# Patient Record
Sex: Male | Born: 1965 | Race: White | Hispanic: No | Marital: Single | Smoking: Current every day smoker
Health system: Southern US, Community
[De-identification: ages and names within clinical notes are randomized; demographics above are authoritative.]

## PROBLEM LIST (undated history)

## (undated) DIAGNOSIS — L309 Dermatitis, unspecified: Secondary | ICD-10-CM

## (undated) DIAGNOSIS — T7840XA Allergy, unspecified, initial encounter: Secondary | ICD-10-CM

## (undated) DIAGNOSIS — I10 Essential (primary) hypertension: Secondary | ICD-10-CM

## (undated) HISTORY — PX: KNEE SURGERY: SHX244

## (undated) HISTORY — DX: Allergy, unspecified, initial encounter: T78.40XA

## (undated) HISTORY — DX: Dermatitis, unspecified: L30.9

---

## 1998-06-19 ENCOUNTER — Emergency Department (HOSPITAL_COMMUNITY): Admission: EM | Admit: 1998-06-19 | Discharge: 1998-06-19 | Payer: Self-pay | Admitting: Emergency Medicine

## 2012-07-23 ENCOUNTER — Encounter (HOSPITAL_COMMUNITY): Payer: Self-pay

## 2012-07-23 ENCOUNTER — Emergency Department (HOSPITAL_COMMUNITY)
Admission: EM | Admit: 2012-07-23 | Discharge: 2012-07-23 | Disposition: A | Discharge status: Home / Self Care | Payer: Self-pay | Attending: Emergency Medicine | Admitting: Emergency Medicine

## 2012-07-23 DIAGNOSIS — M62830 Muscle spasm of back: Secondary | ICD-10-CM

## 2012-07-23 DIAGNOSIS — R209 Unspecified disturbances of skin sensation: Secondary | ICD-10-CM | POA: Insufficient documentation

## 2012-07-23 DIAGNOSIS — M25559 Pain in unspecified hip: Secondary | ICD-10-CM | POA: Insufficient documentation

## 2012-07-23 HISTORY — DX: Essential (primary) hypertension: I10

## 2012-07-23 LAB — URINALYSIS, ROUTINE W REFLEX MICROSCOPIC
Bilirubin Urine: NEGATIVE
Glucose, UA: NEGATIVE mg/dL
Ketones, ur: NEGATIVE mg/dL
pH: 6.5 (ref 5.0–8.0)

## 2012-07-23 MED ORDER — METHOCARBAMOL 750 MG PO TABS: 750.0000 mg | ORAL_TABLET | Freq: Three times a day (TID) | ORAL | Status: DC

## 2012-07-23 MED ORDER — OXYCODONE-ACETAMINOPHEN 5-325 MG PO TABS: 1.0000 | ORAL_TABLET | ORAL | Status: DC | PRN

## 2012-07-23 MED ORDER — MELOXICAM 15 MG PO TABS: 15.0000 mg | ORAL_TABLET | Freq: Every day | ORAL | Status: DC

## 2012-07-23 NOTE — ED Notes (Signed)
Pt reports Sunday night, he reached in his truck to grab his back pack which he reports he normally does when he started to have L flank pain.  Pt reports Monday pain subsided but recurred Tuesday..  Pt reports pain is now in his L lower hip area which radiates to his L leg which causes numbness to his toes at times.  Pt is ambulatory with steady gait.  No assistance needed.

## 2012-07-23 NOTE — ED Notes (Signed)
Unable to sign discharge pad- tech difficulty

## 2012-07-23 NOTE — ED Notes (Signed)
Pt presents with NAD_ Pt reports "pulled muscle on left side of lower back Sunday night" had improvement on Monday yet Tuesday increased pain- Pt prefer standing  Rather than sitting- variable numbness generalized.  Neuro assessment normal  GCS 15

## 2012-07-28 ENCOUNTER — Emergency Department (HOSPITAL_COMMUNITY)
Admission: EM | Admit: 2012-07-28 | Discharge: 2012-07-28 | Disposition: A | Discharge status: Home / Self Care | Payer: Self-pay | Attending: Emergency Medicine | Admitting: Emergency Medicine

## 2012-07-28 ENCOUNTER — Encounter (HOSPITAL_COMMUNITY): Payer: Self-pay | Admitting: Emergency Medicine

## 2012-07-28 ENCOUNTER — Emergency Department (HOSPITAL_COMMUNITY): Payer: Self-pay

## 2012-07-28 DIAGNOSIS — Z79899 Other long term (current) drug therapy: Secondary | ICD-10-CM | POA: Insufficient documentation

## 2012-07-28 DIAGNOSIS — F17200 Nicotine dependence, unspecified, uncomplicated: Secondary | ICD-10-CM | POA: Insufficient documentation

## 2012-07-28 DIAGNOSIS — IMO0002 Reserved for concepts with insufficient information to code with codable children: Secondary | ICD-10-CM | POA: Insufficient documentation

## 2012-07-28 DIAGNOSIS — R5381 Other malaise: Secondary | ICD-10-CM | POA: Insufficient documentation

## 2012-07-28 DIAGNOSIS — M5430 Sciatica, unspecified side: Secondary | ICD-10-CM | POA: Insufficient documentation

## 2012-07-28 DIAGNOSIS — I10 Essential (primary) hypertension: Secondary | ICD-10-CM | POA: Insufficient documentation

## 2012-07-28 MED ORDER — PREDNISONE 20 MG PO TABS: ORAL_TABLET | ORAL | Status: DC

## 2012-07-28 MED ORDER — KETOROLAC TROMETHAMINE 30 MG/ML IJ SOLN
30.0000 mg | Freq: Once | INTRAMUSCULAR | Status: AC
  Administered 2012-07-28: 30 mg via INTRAMUSCULAR
  Filled 2012-07-28: qty 1

## 2012-07-28 MED ORDER — METHOCARBAMOL 500 MG PO TABS: 500.0000 mg | ORAL_TABLET | Freq: Two times a day (BID) | ORAL | Status: DC

## 2012-07-28 MED ORDER — PREDNISONE 20 MG PO TABS
60.0000 mg | ORAL_TABLET | Freq: Once | ORAL | Status: AC
  Administered 2012-07-28: 60 mg via ORAL
  Filled 2012-07-28: qty 3

## 2012-07-28 MED ORDER — OXYCODONE-ACETAMINOPHEN 5-325 MG PO TABS: 1.0000 | ORAL_TABLET | Freq: Four times a day (QID) | ORAL | Status: DC | PRN

## 2012-07-28 MED ORDER — HYDROMORPHONE HCL PF 1 MG/ML IJ SOLN
1.0000 mg | Freq: Once | INTRAMUSCULAR | Status: AC
  Administered 2012-07-28: 1 mg via INTRAMUSCULAR
  Filled 2012-07-28: qty 1

## 2012-07-28 NOTE — ED Notes (Signed)
Pt report back pain that started Sunday and reports numbness and tingling in left index fingertips that was from a childhood injury. Pt AAAOx4. Ambulatory.

## 2012-07-28 NOTE — ED Provider Notes (Signed)
History     CSN: [id number]  Arrival date & time 07/28/12  1602   First MD Initiated Contact with Patient 07/28/12 2037      Chief Complaint  Patient presents with  . Back Pain    (Consider location/radiation/quality/duration/timing/severity/associated sxs/prior treatment) HPI Comments: Patient was seen 7 days ago for LBP treated with Percocet, Mobic and Robaxin with little relief Did slip in the shower several days ago making the pain worse.  States has been taking the medications as prescribed, has no PCP  Patient is a 46 y.o. male presenting with back pain. The history is provided by the patient.  Back Pain  This is a recurrent problem. The problem has not changed since onset.The pain is associated with no known injury. The pain is present in the lumbar spine. The pain is at a severity of 8/10. The pain is moderate. The symptoms are aggravated by certain positions. Associated symptoms include leg pain, tingling and weakness. Pertinent negatives include no chest pain, no fever, no abdominal pain, no dysuria and no paresis.    Past Medical History  Diagnosis Date  . Hypertension     Past Surgical History  Procedure Date  . Knee surgery     History reviewed. No pertinent family history.  History  Substance Use Topics  . Smoking status: Current Every Day Smoker  . Smokeless tobacco: Not on file  . Alcohol Use: Yes      Review of Systems  Constitutional: Negative for fever and chills.  Respiratory: Negative for shortness of breath.   Cardiovascular: Negative for chest pain and leg swelling.  Gastrointestinal: Negative for nausea, vomiting, abdominal pain, diarrhea and constipation.  Genitourinary: Negative for dysuria and flank pain.  Musculoskeletal: Positive for back pain.  Skin: Negative for wound.  Neurological: Positive for tingling and weakness.    Allergies  Review of patient's allergies indicates no known allergies.  Home Medications   Current  Outpatient Rx  Name Route Sig Dispense Refill  . AMLODIPINE-OLMESARTAN 5-20 MG PO TABS Oral Take 1 tablet by mouth daily.    . MELOXICAM 15 MG PO TABS Oral Take 1 tablet (15 mg total) by mouth daily. 10 tablet 0  . METHOCARBAMOL 750 MG PO TABS Oral Take 1 tablet (750 mg total) by mouth 3 (three) times daily. 20 tablet 0  . OXYCODONE-ACETAMINOPHEN 5-325 MG PO TABS Oral Take 1-2 tablets by mouth every 4 (four) hours as needed for pain. 10 tablet 0  . PREDNISONE 10 MG PO TABS Oral Take 10 mg by mouth daily.    Marland Kitchen METHOCARBAMOL 500 MG PO TABS Oral Take 1 tablet (500 mg total) by mouth 2 (two) times daily. 20 tablet 0  . OXYCODONE-ACETAMINOPHEN 5-325 MG PO TABS Oral Take 1-2 tablets by mouth every 6 (six) hours as needed for pain. 20 tablet 0  . PREDNISONE 20 MG PO TABS  3 Tabs PO Days 1-3, then 2 tabs PO Days 4-6, then 1 tab PO Day 7-9, then Half Tab PO Day 10-12 20 tablet 0    BP 154/91  Pulse 81  Temp 98.4 F (36.9 C) (Oral)  Resp 20  Ht 5\' 8"  (1.727 m)  Wt 185 lb (83.915 kg)  BMI 28.13 kg/m2  SpO2 99%  Physical Exam  Constitutional: He appears well-developed and well-nourished.  HENT:  Head: Normocephalic.  Eyes: Pupils are equal, round, and reactive to light.  Neck: Normal range of motion.  Cardiovascular: Normal rate.   Pulmonary/Chest: Effort normal.  Musculoskeletal:  Normal range of motion.  Neurological: He is alert.  Skin: Skin is warm.    ED Course  Procedures (including critical care time)  Labs Reviewed - No data to display Dg Lumbar Spine Complete  07/28/2012  *RADIOLOGY REPORT*  Clinical Data: Fall.  Low back pain.  Pain radiating down the left leg.  LUMBAR SPINE - COMPLETE 4+ VIEW  Comparison: None.  Findings: Five lumbar type vertebral bodies.  Vascular calcifications in the anatomic pelvis.  No pars defects.  Vertebral body height is preserved.  Mild disc space narrowing L3-L4.  No compression fracture.  Thoracolumbar junction appears normal. Lumbosacral junction  normal.  The alignment is anatomic.  IMPRESSION: Mild lumbar spondylosis.  No acute osseous abnormality.   Original Report Authenticated By: Andreas Newport, M.D.      1. Sciatica       MDM  persist LBP        Arman Filter, NP 07/28/12 2122  Arman Filter, NP 07/28/12 2122  Arman Filter, NP 07/28/12 2122

## 2012-07-28 NOTE — ED Notes (Signed)
Patient states he slipped a disc in his back last Sunday and his pain hasn't improved.  Patient states he is having numbness and tingling in toes.

## 2012-07-28 NOTE — ED Provider Notes (Signed)
Medical screening examination/treatment/procedure(s) were performed by non-physician practitioner and as supervising physician I was immediately available for consultation/collaboration.   Celene Kras, MD 07/28/12 2149

## 2012-08-08 NOTE — ED Provider Notes (Signed)
History     CSN: [id number]  Arrival date & time 07/23/12  1233   First MD Initiated Contact with Patient 07/23/12 1258      Chief Complaint  Patient presents with  . Back Pain    (Consider location/radiation/quality/duration/timing/severity/associated sxs/prior treatment) HPI Comments: Paul [name] 46 y.o. male   The chief complaint is: Patient presents with:   Back Pain    Paitient with history intermittent back pain presents with increased back pain. Patient fell in the shower yesterday and has had severe back pain. Difficulty standing erect. Worse with walking, twisting.  Nothing makes it better. Pain 8/10 Denies weakness, loss of bowel/bladder function or saddle anesthesia. Denies neck stiffness, headache, rash.  Denies fever or recent procedures to back.  Denies fevers, chills, myalgias, arthralgias. Denies DOE, SOB, chest tightness or pressure, radiation to left arm, jaw or back, or diaphoresis. Denies dysuria, flank pain, suprapubic pain, frequency, urgency, or hematuria. Denies headaches, light headedness, weakness, visual disturbances. Denies abdominal pain, nausea, vomiting, diarrhea or constipation.     Patient is a 46 y.o. male presenting with back pain. The history is provided by the patient. No language interpreter was used.  Back Pain  Pertinent negatives include no chest pain, no fever, no numbness, no headaches, no abdominal pain and no dysuria.    Past Medical History  Diagnosis Date  . Hypertension     Past Surgical History  Procedure Date  . Knee surgery     No family history on file.  History  Substance Use Topics  . Smoking status: Current Every Day Smoker  . Smokeless tobacco: Not on file  . Alcohol Use: Yes      Review of Systems  Constitutional: Negative for fever and chills.  Respiratory: Negative for cough and shortness of breath.   Cardiovascular: Negative for chest pain and palpitations.  Gastrointestinal: Negative for  vomiting, abdominal pain, diarrhea and constipation.  Genitourinary: Negative for dysuria, urgency and frequency.  Musculoskeletal: Positive for back pain and gait problem. Negative for myalgias and arthralgias.  Skin: Negative for rash.  Neurological: Negative for numbness and headaches.  All other systems reviewed and are negative.    Allergies  Review of patient's allergies indicates no known allergies.  Home Medications   Current Outpatient Rx  Name Route Sig Dispense Refill  . AMLODIPINE-OLMESARTAN 5-20 MG PO TABS Oral Take 1 tablet by mouth daily.    Marland Kitchen PREDNISONE 10 MG PO TABS Oral Take 10 mg by mouth daily.    . MELOXICAM 15 MG PO TABS Oral Take 1 tablet (15 mg total) by mouth daily. 10 tablet 0  . METHOCARBAMOL 500 MG PO TABS Oral Take 1 tablet (500 mg total) by mouth 2 (two) times daily. 20 tablet 0  . METHOCARBAMOL 750 MG PO TABS Oral Take 1 tablet (750 mg total) by mouth 3 (three) times daily. 20 tablet 0  . OXYCODONE-ACETAMINOPHEN 5-325 MG PO TABS Oral Take 1-2 tablets by mouth every 4 (four) hours as needed for pain. 10 tablet 0  . OXYCODONE-ACETAMINOPHEN 5-325 MG PO TABS Oral Take 1-2 tablets by mouth every 6 (six) hours as needed for pain. 20 tablet 0  . PREDNISONE 20 MG PO TABS  3 Tabs PO Days 1-3, then 2 tabs PO Days 4-6, then 1 tab PO Day 7-9, then Half Tab PO Day 10-12 20 tablet 0    BP 146/97  Pulse 89  Temp 98.2 F (36.8 C) (Oral)  Resp 18  Ht 5'  8" (1.727 m)  Wt 185 lb (83.915 kg)  BMI 28.13 kg/m2  SpO2 100%  Physical Exam  Nursing note and vitals reviewed. Constitutional: He appears well-developed and well-nourished. No distress.  HENT:  Head: Normocephalic and atraumatic.  Eyes: Conjunctivae normal are normal. No scleral icterus.  Neck: Normal range of motion. Neck supple.  Cardiovascular: Normal rate, regular rhythm and normal heart sounds.   Pulmonary/Chest: Effort normal and breath sounds normal. No respiratory distress.  Abdominal: Soft. There  is no tenderness.  Musculoskeletal: He exhibits no edema.       Lumbar back: He exhibits decreased range of motion, tenderness, pain and spasm. He exhibits no bony tenderness, no swelling, no edema, no deformity, no laceration and normal pulse.       Back:       TTP left paraspinals and glute. Strength 5/5 but painful. Negative straight leg. NV intact.  Neurological: He is alert.  Skin: Skin is warm and dry. He is not diaphoretic.  Psychiatric: His behavior is normal.    ED Course  Procedures (including critical care time)   Urinalysis, Routine w reflex microscopic (Final result)   Component (Lab Inquiry)      Result Time  Color, Urine  APPearance  Specific Gravity, Urine  pH  GLUCOSE U    07/23/12 1354  YELLOW  CLEAR  1.006  6.5  NEGATIVE           Result Time  Hgb urine dipstick  BILI UR  Ketones, ur  Protein, ur  Urobilinogen, UA    07/23/12 1354  NEGATIVE  NEGATIVE  NEGATIVE  NEGATIVE  0.2           Result Time  Nitrite  LEUKOCYTES    07/23/12 1354  NEGATIVE  NEGATIVE MICROSCOPIC NOT DONE ON URINES WITH NEGATIVE PROTEIN, BLOOD, LEUKOCYTES, NITRITE, OR GLUCOSE <1000 mg/dL.     No results found.   1. Muscle spasm of back       MDM  Patient hx/pe consistent with spasm. No red flag sxs. UA negative and urolith unlikely.   Will treat nsaids, percocet, and mm relaxer. Discussed reasons to seek immediate care. Patient expresses understanding and agrees with plan.         Arthor Captain, PA-C 08/08/12 [phone]

## 2012-08-10 NOTE — ED Provider Notes (Signed)
Medical screening examination/treatment/procedure(s) were performed by non-physician practitioner and as supervising physician I was immediately available for consultation/collaboration.   Rolan Bucco, MD 08/10/12 1524

## 2013-10-12 ENCOUNTER — Telehealth: Payer: Self-pay

## 2013-10-12 ENCOUNTER — Ambulatory Visit (INDEPENDENT_AMBULATORY_CARE_PROVIDER_SITE_OTHER): Payer: BC Managed Care – PPO | Admitting: Family Medicine

## 2013-10-12 VITALS — BP 205/113 | HR 100 | Temp 97.7°F | Resp 18 | Ht 67.5 in | Wt 192.0 lb

## 2013-10-12 DIAGNOSIS — I10 Essential (primary) hypertension: Secondary | ICD-10-CM

## 2013-10-12 DIAGNOSIS — L309 Dermatitis, unspecified: Secondary | ICD-10-CM | POA: Insufficient documentation

## 2013-10-12 DIAGNOSIS — L259 Unspecified contact dermatitis, unspecified cause: Secondary | ICD-10-CM

## 2013-10-12 MED ORDER — TRIAMCINOLONE ACETONIDE 0.1 % EX CREA: 1.0000 "application " | TOPICAL_CREAM | Freq: Two times a day (BID) | CUTANEOUS | Status: DC

## 2013-10-12 MED ORDER — AMLODIPINE BESYLATE 10 MG PO TABS: 10.0000 mg | ORAL_TABLET | Freq: Every day | ORAL | Status: DC

## 2013-10-12 MED ORDER — CLONIDINE HCL 0.1 MG PO TABS
0.1000 mg | ORAL_TABLET | Freq: Once | ORAL | Status: AC
  Administered 2013-10-12: 0.1 mg via ORAL

## 2013-10-12 MED ORDER — LOSARTAN POTASSIUM 50 MG PO TABS: 50.0000 mg | ORAL_TABLET | Freq: Every day | ORAL | Status: DC

## 2013-10-12 MED ORDER — PREDNISONE 20 MG PO TABS: ORAL_TABLET | ORAL | Status: DC

## 2013-10-12 NOTE — Patient Instructions (Addendum)
I am sorry that you are so miserable!    Please come and see me on Wednesday.  Assuming that your BP is under reasonable control we can go ahead and do your steroid shot then.    In the meantime start taking losartan 50 mg a day.  We can add your amlodipine back as well in a few days.  For now hold onto the amlodipine and prednisone pill rx- we will probably start these on Wednesday

## 2013-10-12 NOTE — Telephone Encounter (Signed)
Pt would like to know if he should take a second dosage of the lasartan when he came in today his blood pressure read 205/113 now the reading is 161/105 after taking the first dosage of losartan today in our office. Please advise. Best# [phone]

## 2013-10-12 NOTE — Progress Notes (Signed)
[facility] [address], [place] [address] [phone]  Date:  10/12/2013   Name:  Paul DestineMichael [name]   DOB:  [birth year]   MRN:  [id number]  PCP:  No primary provider on file.    Chief Complaint: Eczema and Hypertension   History of Present Illness:  Paul [name] is a 48 y.o. very pleasant male patient who presents with the following:  He recently got back on insurance and is here to address a couple of chronic health problems.  He has a history of eczema which has gotten out of control.  He is very uncomfortable right now due to itchy and irritated skin.  "I need a shot of cortisone and a prescription for steroid pills."  This regimen has helped him in the past.  He had been on azor for about one year, but ran out a couple of months ago.   He has had HTN for a long time.  He has not noted any symptoms or been aware that his BP was so high.  He is not having any headaches or CP.  His skin is very irritated and is bothering him a lot.    There are no active problems to display for this patient.   Past Medical History  Diagnosis Date  . Hypertension   . Allergy   . Eczema     Past Surgical History  Procedure Laterality Date  . Knee surgery      History  Substance Use Topics  . Smoking status: Current Every Day Smoker  . Smokeless tobacco: Not on file  . Alcohol Use: Yes    History reviewed. No pertinent family history.  No Known Allergies  Medication list has been reviewed and updated.  Current Outpatient Prescriptions on File Prior to Visit  Medication Sig Dispense Refill  . amLODipine-olmesartan (AZOR) 5-20 MG per tablet Take 1 tablet by mouth daily.      . meloxicam (MOBIC) 15 MG tablet Take 1 tablet (15 mg total) by mouth daily.  10 tablet  0  . methocarbamol (ROBAXIN) 500 MG tablet Take 1 tablet (500 mg total) by mouth 2 (two) times daily.  20 tablet  0  . methocarbamol (ROBAXIN) 750 MG tablet Take 1 tablet (750 mg total) by mouth 3  (three) times daily.  20 tablet  0  . oxyCODONE-acetaminophen (PERCOCET) 5-325 MG per tablet Take 1-2 tablets by mouth every 4 (four) hours as needed for pain.  10 tablet  0  . oxyCODONE-acetaminophen (PERCOCET/ROXICET) 5-325 MG per tablet Take 1-2 tablets by mouth every 6 (six) hours as needed for pain.  20 tablet  0  . predniSONE (DELTASONE) 10 MG tablet Take 10 mg by mouth daily.      . predniSONE (DELTASONE) 20 MG tablet 3 Tabs PO Days 1-3, then 2 tabs PO Days 4-6, then 1 tab PO Day 7-9, then Half Tab PO Day 10-12  20 tablet  0   No current facility-administered medications on file prior to visit.    Review of Systems:  As per HPI- otherwise negative.   Physical Examination: Filed Vitals:   10/12/13 1525  BP: 230/120  Pulse: 100  Temp: 99.4 F (37.4 C)  Resp: 18   Filed Vitals:   10/12/13 1525  Height: 5' 7.5" (1.715 m)  Weight: 192 lb (87.091 kg)   Body mass index is 29.61 kg/(m^2). Ideal Body Weight: Weight in (lb) to have BMI = 25: 161.7  GEN: WDWN, NAD, Non-toxic,  A & O x 3.   HEENT: Atraumatic, Normocephalic. Neck supple. No masses, No LAD.  Bilateral TM wnl, oropharynx normal.  PEERL,EOMI.   Ears and Nose: No external deformity. CV: RRR, No M/G/R. No JVD. No thrill. No extra heart sounds. PULM: CTA B, no wheezes, crackles, rhonchi. No retractions. No resp. distress. No accessory muscle use. ABD: S, NT, ND, +BS. No rebound. No HSM. EXTR: No c/c/e NEURO Normal gait.  PSYCH: Normally interactive. Conversant. Not depressed or anxious appearing.  Calm demeanor.  Very dry, irritated eczema rash on his face, trunk and arms.  Less so on his legs   Given clonidine 0.1 in clinic and BP improved as per VS flowsheet EKG: short PR, otherwise normal.    Assessment and Plan: HTN (hypertension) - Plan: cloNIDine (CATAPRES) tablet 0.1 mg, EKG 12-Lead, losartan (COZAAR) 50 MG tablet, amLODipine (NORVASC) 10 MG tablet  Eczema - Plan: predniSONE (DELTASONE) 20 MG tablet,  triamcinolone cream (KENALOG) 0.1 %  Explained that at this time I do not want to treat him with steroids because his BP is so high.  Will have him start on losartan, plan to recheck on Wednesday.  Gave rx for amlodipine and prednisone as well- he can go ahead and fill these rx but not start yet.    Signed Abbe Amsterdam, MD

## 2013-10-13 ENCOUNTER — Encounter: Payer: Self-pay | Admitting: Radiology

## 2013-10-13 NOTE — Telephone Encounter (Signed)
Called him, he is to use the medication once daily, his BP was very elevated, it is improving so this is good. It will not be normal range after one day of treatment. Left message for him to call me back, he is to come in tomorrow for recheck.

## 2013-10-13 NOTE — Telephone Encounter (Signed)
Called again. No answer, did not leave another message.

## 2013-10-14 ENCOUNTER — Ambulatory Visit (INDEPENDENT_AMBULATORY_CARE_PROVIDER_SITE_OTHER): Payer: BC Managed Care – PPO | Admitting: Family Medicine

## 2013-10-14 VITALS — BP 146/100 | HR 76 | Temp 98.1°F | Resp 16 | Ht 68.0 in | Wt 195.2 lb

## 2013-10-14 DIAGNOSIS — I10 Essential (primary) hypertension: Secondary | ICD-10-CM

## 2013-10-14 DIAGNOSIS — L309 Dermatitis, unspecified: Secondary | ICD-10-CM

## 2013-10-14 DIAGNOSIS — L259 Unspecified contact dermatitis, unspecified cause: Secondary | ICD-10-CM

## 2013-10-14 MED ORDER — METHYLPREDNISOLONE ACETATE 80 MG/ML IJ SUSP
80.0000 mg | Freq: Once | INTRAMUSCULAR | Status: AC
  Administered 2013-10-14: 80 mg via INTRAMUSCULAR

## 2013-10-14 NOTE — Patient Instructions (Signed)
Go ahead and add on 5mg  of amlodipine to your BP regimen.  Keep an eye on your pressure over the next few days.  You can start taking the oral prednisone taper tomorrow.    Please come and see me in about a month (you can also schedule an appt if your prefer) to recheck your blood pressure and do labs.    Let me know if you are not feeling better!

## 2013-10-14 NOTE — Progress Notes (Signed)
[facility] [address], [place] [address] [phone]  Date:  10/14/2013   Name:  Paul [name]   DOB:  May 25, 1966   MRN:  [id number]  PCP:  No primary provider on file.    Chief Complaint: Follow-up   History of Present Illness:  Paul [name] is a 48 y.o. very pleasant male patient who presents with the following:  He is here today for a recheck.  He has severe eczema and would like to have a steroid shot to help bring it under control if possible He was seen 2 days ago but could not have a shot because his BP was too high.  We started BP meds and he came back today for follow-up.   He last had a shot a few years ago- "at least."  He has responded well to steroids in the past He is just taking the losartan 50 mg right now- he has not added the amloidpine yet.    He has the prednisone rx but has not started using it yet   Patient Active Problem List   Diagnosis Date Noted  . Eczema 10/12/2013  . HTN (hypertension) 10/12/2013    Past Medical History  Diagnosis Date  . Hypertension   . Allergy   . Eczema     Past Surgical History  Procedure Laterality Date  . Knee surgery      History  Substance Use Topics  . Smoking status: Current Every Day Smoker  . Smokeless tobacco: Not on file  . Alcohol Use: Yes    History reviewed. No pertinent family history.  Allergies  Allergen Reactions  . Penicillins     Medication list has been reviewed and updated.  Current Outpatient Prescriptions on File Prior to Visit  Medication Sig Dispense Refill  . losartan (COZAAR) 50 MG tablet Take 1 tablet (50 mg total) by mouth daily.  30 tablet  3  . predniSONE (DELTASONE) 20 MG tablet Take 2 pills daily for 4 days, then 1 pill daily for 4 days  12 tablet  0  . triamcinolone cream (KENALOG) 0.1 % Apply 1 application topically 2 (two) times daily.  454 g  1  . amLODipine (NORVASC) 10 MG tablet Take 1 tablet (10 mg total) by mouth daily.  30 tablet  3   . oxyCODONE-acetaminophen (PERCOCET) 5-325 MG per tablet Take 1-2 tablets by mouth every 4 (four) hours as needed for pain.  10 tablet  0  . oxyCODONE-acetaminophen (PERCOCET/ROXICET) 5-325 MG per tablet Take 1-2 tablets by mouth every 6 (six) hours as needed for pain.  20 tablet  0  . predniSONE (DELTASONE) 10 MG tablet Take 10 mg by mouth daily.       No current facility-administered medications on file prior to visit.    Review of Systems:  As per HPI- otherwise negative.   Physical Examination: Filed Vitals:   10/14/13 1406  BP: 146/100  Pulse: 76  Temp: 98.1 F (36.7 C)  Resp: 16   Filed Vitals:   10/14/13 1406  Height: 5\' 8"  (1.727 m)  Weight: 195 lb 3.2 oz (88.542 kg)   Body mass index is 29.69 kg/(m^2). Ideal Body Weight: Weight in (lb) to have BMI = 25: 164.1  GEN: WDWN, NAD, Non-toxic, A & O x 3, overweight, looks well Severe dry skin and eczema HEENT: Atraumatic, Normocephalic. Neck supple. No masses, No LAD. Ears and Nose: No external deformity. CV: RRR, No M/G/R. No JVD. No thrill.  No extra heart sounds. PULM: CTA B, no wheezes, crackles, rhonchi. No retractions. No resp. distress. No accessory muscle use. EXTR: No c/c/e NEURO Normal gait.  PSYCH: Normally interactive. Conversant. Not depressed or anxious appearing.  Calm demeanor.    Assessment and Plan: Eczema - Plan: methylPREDNISolone acetate (DEPO-MEDROL) injection 80 mg  HTN (hypertension)  BP is under better control- add amlodipine 5mg .   Plan to recheck BP in about one month  Recommended that we check labs today but he declines.  He has not history of DM per his report   Signed Abbe Amsterdam, MD

## 2013-10-15 NOTE — Telephone Encounter (Signed)
He did come in as directed, and was seen yesterday

## 2016-04-12 ENCOUNTER — Ambulatory Visit (HOSPITAL_COMMUNITY)
Admission: EM | Admit: 2016-04-12 | Discharge: 2016-04-12 | Disposition: A | Discharge status: Home / Self Care | Payer: BLUE CROSS/BLUE SHIELD | Attending: Emergency Medicine | Admitting: Emergency Medicine

## 2016-04-12 ENCOUNTER — Encounter (HOSPITAL_COMMUNITY): Payer: Self-pay | Admitting: Emergency Medicine

## 2016-04-12 DIAGNOSIS — L309 Dermatitis, unspecified: Secondary | ICD-10-CM

## 2016-04-12 DIAGNOSIS — I10 Essential (primary) hypertension: Secondary | ICD-10-CM | POA: Diagnosis not present

## 2016-04-12 MED ORDER — METHYLPREDNISOLONE SODIUM SUCC 125 MG IJ SOLR
INTRAMUSCULAR | Status: AC
  Filled 2016-04-12: qty 2

## 2016-04-12 MED ORDER — METHYLPREDNISOLONE SODIUM SUCC 125 MG IJ SOLR
125.0000 mg | Freq: Once | INTRAMUSCULAR | Status: AC
  Administered 2016-04-12: 125 mg via INTRAMUSCULAR

## 2016-04-12 MED ORDER — AMLODIPINE BESYLATE 5 MG PO TABS: 5.0000 mg | ORAL_TABLET | Freq: Every day | ORAL | Status: DC

## 2016-04-12 MED ORDER — TRIAMCINOLONE ACETONIDE 0.1 % EX CREA: 1.0000 "application " | TOPICAL_CREAM | Freq: Two times a day (BID) | CUTANEOUS | Status: DC

## 2016-04-12 NOTE — ED Provider Notes (Signed)
CSN: [id number]     Arrival date & time 04/12/16  1757 History   First MD Initiated Contact with Patient 04/12/16 1835     Chief Complaint  Patient presents with  . Hypertension  . Eczema   (Consider location/radiation/quality/duration/timing/severity/associated sxs/prior Treatment) HPI History obtained from patient: Location: skin  Context/Duration:on going eczema for many years, gets better and then gets worse. Has not seen dermatology. Would like refills of his meication and would like treatment for HTN.  States he had been on medication a few years ago, but lost his insurance. States he just just insurance back and  would like to get restarted on medication. Also would like refill of his triamcinolone.    Severity: 0  Quality:itchy Timing:         constant   Home Treatment:  none Associated symptoms:  No headache, blurred vision, CP Family History:    Past Medical History  Diagnosis Date  . Hypertension   . Allergy   . Eczema    Past Surgical History  Procedure Laterality Date  . Knee surgery     No family history on file. Social History  Substance Use Topics  . Smoking status: Current Every Day Smoker  . Smokeless tobacco: Not on file  . Alcohol Use: Yes    Review of Systems  Denies: HEADACHE, NAUSEA, ABDOMINAL PAIN, CHEST PAIN, CONGESTION, DYSURIA, SHORTNESS OF BREATH  Allergies  Penicillins  Home Medications   Prior to Admission medications   Medication Sig Start Date End Date Taking? Authorizing Provider  amLODipine (NORVASC) 10 MG tablet Take 1 tablet (10 mg total) by mouth daily. 10/12/13   Gwenlyn FoundJessica C Copland, MD  amLODipine (NORVASC) 5 MG tablet Take 1 tablet (5 mg total) by mouth daily. 04/12/16   Tharon AquasFrank C Patrick, PA  losartan (COZAAR) 50 MG tablet Take 1 tablet (50 mg total) by mouth daily. 10/12/13   Pearline CablesJessica C Copland, MD  predniSONE (DELTASONE) 20 MG tablet Take 2 pills daily for 4 days, then 1 pill daily for 4 days 10/12/13   Pearline CablesJessica C Copland, MD   triamcinolone cream (KENALOG) 0.1 % Apply 1 application topically 2 (two) times daily. 10/12/13   Gwenlyn FoundJessica C Copland, MD  triamcinolone cream (KENALOG) 0.1 % Apply 1 application topically 2 (two) times daily. 04/12/16   Tharon AquasFrank C Patrick, PA   Meds Ordered and Administered this Visit   Medications  methylPREDNISolone sodium succinate (SOLU-MEDROL) 125 mg/2 mL injection 125 mg (not administered)    BP 170/109 mmHg  Pulse 91  Temp(Src) 97.8 F (36.6 C) (Oral)  Resp 16  SpO2 98% No data found.   Physical Exam NURSES NOTES AND VITAL SIGNS REVIEWED. CONSTITUTIONAL: Well developed, well nourished, no acute distress HEENT: normocephalic, atraumatic EYES: Conjunctiva normal NECK:normal ROM, supple, no adenopathy PULMONARY:No respiratory distress, normal effort ABDOMINAL: Soft, ND, NT BS+, No CVAT MUSCULOSKELETAL: Normal ROM of all extremities,  SKIN: warm and dry diffuse eczema changes, arms neck PSYCHIATRIC: Mood and affect, behavior are normal  ED Course  Procedures (including critical care time)  Labs Review Labs Reviewed - No data to display  Imaging Review No results found.   Visual Acuity Review  Right Eye Distance:   Left Eye Distance:   Bilateral Distance:    Right Eye Near:   Left Eye Near:    Bilateral Near:         MDM   1. Eczema   2. Essential hypertension     Patient is reassured that there are  no issues that require transfer to higher level of care at this time or additional tests. Patient is advised to continue home symptomatic treatment. Patient is advised that if there are new or worsening symptoms to attend the emergency department, contact primary care provider, or return to UC. Instructions of care provided discharged home in stable condition.    THIS NOTE WAS GENERATED USING A VOICE RECOGNITION SOFTWARE PROGRAM. ALL REASONABLE EFFORTS  WERE MADE TO PROOFREAD THIS DOCUMENT FOR ACCURACY.  I have verbally reviewed the discharge instructions  with the patient. A printed AVS was given to the patient.  All questions were answered prior to discharge.      Tharon AquasFrank C Patrick, PA 04/12/16 Windell [id number]

## 2016-04-12 NOTE — ED Notes (Signed)
Patient has concerns for high blood pressure.  Patient has been without health insurance until recently and has been unable to see a doctor and/or pay for prescriptions.  Patient has eczema history, patient having a flare up of symptoms

## 2016-04-12 NOTE — Discharge Instructions (Signed)
Hypertension Hypertension is another name for high blood pressure. High blood pressure forces your heart to work harder to pump blood. A blood pressure reading has two numbers, which includes a higher number over a lower number (example: 110/72). HOME CARE   Have your blood pressure rechecked by your doctor.  Only take medicine as told by your doctor. Follow the directions carefully. The medicine does not work as well if you skip doses. Skipping doses also puts you at risk for problems.  Do not smoke.  Monitor your blood pressure at home as told by your doctor. GET HELP IF:  You think you are having a reaction to the medicine you are taking.  You have repeat headaches or feel dizzy.  You have puffiness (swelling) in your ankles.  You have trouble with your vision. GET HELP RIGHT AWAY IF:   You get a very bad headache and are confused.  You feel weak, numb, or faint.  You get chest or belly (abdominal) pain.  You throw up (vomit).  You cannot breathe very well. MAKE SURE YOU:   Understand these instructions.  Will watch your condition.  Will get help right away if you are not doing well or get worse.   This information is not intended to replace advice given to you by your health care provider. Make sure you discuss any questions you have with your health care provider.   Document Released: 03/12/2008 Document Revised: 09/29/2013 Document Reviewed: 07/17/2013 Elsevier Interactive Patient Education 2016 Elsevier Inc. Managing Your High Blood Pressure Blood pressure is a measurement of how forceful your blood is pressing against the walls of the arteries. Arteries are muscular tubes within the circulatory system. Blood pressure does not stay the same. Blood pressure rises when you are active, excited, or nervous; and it lowers during sleep and relaxation. If the numbers measuring your blood pressure stay above normal most of the time, you are at risk for health problems.  High blood pressure (hypertension) is a long-term (chronic) condition in which blood pressure is elevated. A blood pressure reading is recorded as two numbers, such as 120 over 80 (or 120/80). The first, higher number is called the systolic pressure. It is a measure of the pressure in your arteries as the heart beats. The second, lower number is called the diastolic pressure. It is a measure of the pressure in your arteries as the heart relaxes between beats.  Keeping your blood pressure in a normal range is important to your overall health and prevention of health problems, such as heart disease and stroke. When your blood pressure is uncontrolled, your heart has to work harder than normal. High blood pressure is a very common condition in adults because blood pressure tends to rise with age. Men and women are equally likely to have hypertension but at different times in life. Before age 49, men are more likely to have hypertension. After 49 years of age, women are more likely to have it. Hypertension is especially common in African Americans. This condition often has no signs or symptoms. The cause of the condition is usually not known. Your caregiver can help you come up with a plan to keep your blood pressure in a normal, healthy range. BLOOD PRESSURE STAGES Blood pressure is classified into four stages: normal, prehypertension, stage 1, and stage 2. Your blood pressure reading will be used to determine what type of treatment, if any, is necessary. Appropriate treatment options are tied to these four stages:  Normal  Systolic pressure (mm Hg): below 120.  Diastolic pressure (mm Hg): below 80. Prehypertension  Systolic pressure (mm Hg): 120 to 139.  Diastolic pressure (mm Hg): 80 to 89. Stage1  Systolic pressure (mm Hg): 140 to 159.  Diastolic pressure (mm Hg): 90 to 99. Stage2  Systolic pressure (mm Hg): 160 or above.  Diastolic pressure (mm Hg): 100 or above. RISKS RELATED TO HIGH  BLOOD PRESSURE Managing your blood pressure is an important responsibility. Uncontrolled high blood pressure can lead to:  A heart attack.  A stroke.  A weakened blood vessel (aneurysm).  Heart failure.  Kidney damage.  Eye damage.  Metabolic syndrome.  Memory and concentration problems. HOW TO MANAGE YOUR BLOOD PRESSURE Blood pressure can be managed effectively with lifestyle changes and medicines (if needed). Your caregiver will help you come up with a plan to bring your blood pressure within a normal range. Your plan should include the following: Education  Read all information provided by your caregivers about how to control blood pressure.  Educate yourself on the latest guidelines and treatment recommendations. New research is always being done to further define the risks and treatments for high blood pressure. Lifestylechanges  Control your weight.  Avoid smoking.  Stay physically active.  Reduce the amount of salt in your diet.  Reduce stress.  Control any chronic conditions, such as high cholesterol or diabetes.  Reduce your alcohol intake. Medicines  Several medicines (antihypertensive medicines) are available, if needed, to bring blood pressure within a normal range. Communication  Review all the medicines you take with your caregiver because there may be side effects or interactions.  Talk with your caregiver about your diet, exercise habits, and other lifestyle factors that may be contributing to high blood pressure.  See your caregiver regularly. Your caregiver can help you create and adjust your plan for managing high blood pressure. RECOMMENDATIONS FOR TREATMENT AND FOLLOW-UP  The following recommendations are based on current guidelines for managing high blood pressure in nonpregnant adults. Use these recommendations to identify the proper follow-up period or treatment option based on your blood pressure reading. You can discuss these options with  your caregiver.  Systolic pressure of 120 to 139 or diastolic pressure of 80 to 89: Follow up with your caregiver as directed.  Systolic pressure of 140 to 160 or diastolic pressure of 90 to 100: Follow up with your caregiver within 2 months.  Systolic pressure above 160 or diastolic pressure above 100: Follow up with your caregiver within 1 month.  Systolic pressure above 180 or diastolic pressure above 110: Consider antihypertensive therapy; follow up with your caregiver within 1 week.  Systolic pressure above 200 or diastolic pressure above 120: Begin antihypertensive therapy; follow up with your caregiver within 1 week.   This information is not intended to replace advice given to you by your health care provider. Make sure you discuss any questions you have with your health care provider.   Document Released: 06/18/2012 Document Reviewed: 06/18/2012 Elsevier Interactive Patient Education 2016 Elsevier Inc. Eczema Eczema, also called atopic dermatitis, is a skin disorder that causes inflammation of the skin. It causes a red rash and dry, scaly skin. The skin becomes very itchy. Eczema is generally worse during the cooler winter months and often improves with the warmth of summer. Eczema usually starts showing signs in infancy. Some children outgrow eczema, but it may last through adulthood.  CAUSES  The exact cause of eczema is not known, but it appears to run in  families. People with eczema often have a family history of eczema, allergies, asthma, or hay fever. Eczema is not contagious. Flare-ups of the condition may be caused by:   Contact with something you are sensitive or allergic to.   Stress. SIGNS AND SYMPTOMS  Dry, scaly skin.   Red, itchy rash.   Itchiness. This may occur before the skin rash and may be very intense.  DIAGNOSIS  The diagnosis of eczema is usually made based on symptoms and medical history. TREATMENT  Eczema cannot be cured, but symptoms usually  can be controlled with treatment and other strategies. A treatment plan might include:  Controlling the itching and scratching.   Use over-the-counter antihistamines as directed for itching. This is especially useful at night when the itching tends to be worse.   Use over-the-counter steroid creams as directed for itching.   Avoid scratching. Scratching makes the rash and itching worse. It may also result in a skin infection (impetigo) due to a break in the skin caused by scratching.   Keeping the skin well moisturized with creams every day. This will seal in moisture and help prevent dryness. Lotions that contain alcohol and water should be avoided because they can dry the skin.   Limiting exposure to things that you are sensitive or allergic to (allergens).   Recognizing situations that cause stress.   Developing a plan to manage stress.  HOME CARE INSTRUCTIONS   Only take over-the-counter or prescription medicines as directed by your health care provider.   Do not use anything on the skin without checking with your health care provider.   Keep baths or showers short (5 minutes) in warm (not hot) water. Use mild cleansers for bathing. These should be unscented. You may add nonperfumed bath oil to the bath water. It is best to avoid soap and bubble bath.   Immediately after a bath or shower, when the skin is still damp, apply a moisturizing ointment to the entire body. This ointment should be a petroleum ointment. This will seal in moisture and help prevent dryness. The thicker the ointment, the better. These should be unscented.   Keep fingernails cut short. Children with eczema may need to wear soft gloves or mittens at night after applying an ointment.   Dress in clothes made of cotton or cotton blends. Dress lightly, because heat increases itching.   A child with eczema should stay away from anyone with fever blisters or cold sores. The virus that causes fever  blisters (herpes simplex) can cause a serious skin infection in children with eczema. SEEK MEDICAL CARE IF:   Your itching interferes with sleep.   Your rash gets worse or is not better within 1 week after starting treatment.   You see pus or soft yellow scabs in the rash area.   You have a fever.   You have a rash flare-up after contact with someone who has fever blisters.    This information is not intended to replace advice given to you by your health care provider. Make sure you discuss any questions you have with your health care provider.   Document Released: 09/21/2000 Document Revised: 07/15/2013 Document Reviewed: 04/27/2013 Elsevier Interactive Patient Education [employer]2016 Elsevier Inc.

## 2016-08-26 ENCOUNTER — Encounter (HOSPITAL_COMMUNITY): Payer: Self-pay | Admitting: Family Medicine

## 2016-08-26 ENCOUNTER — Ambulatory Visit (HOSPITAL_COMMUNITY)
Admission: EM | Admit: 2016-08-26 | Discharge: 2016-08-26 | Disposition: A | Discharge status: Home / Self Care | Payer: BLUE CROSS/BLUE SHIELD | Attending: Emergency Medicine | Admitting: Emergency Medicine

## 2016-08-26 ENCOUNTER — Ambulatory Visit (INDEPENDENT_AMBULATORY_CARE_PROVIDER_SITE_OTHER): Payer: BLUE CROSS/BLUE SHIELD

## 2016-08-26 DIAGNOSIS — I10 Essential (primary) hypertension: Secondary | ICD-10-CM

## 2016-08-26 DIAGNOSIS — J209 Acute bronchitis, unspecified: Secondary | ICD-10-CM

## 2016-08-26 MED ORDER — PREDNISONE 50 MG PO TABS: ORAL_TABLET | ORAL | 0 refills | Status: DC

## 2016-08-26 MED ORDER — LOSARTAN POTASSIUM 50 MG PO TABS: 50.0000 mg | ORAL_TABLET | Freq: Every day | ORAL | 2 refills | Status: DC

## 2016-08-26 MED ORDER — AMLODIPINE BESYLATE 5 MG PO TABS: 5.0000 mg | ORAL_TABLET | Freq: Every day | ORAL | 2 refills | Status: DC

## 2016-08-26 MED ORDER — BENZONATATE 100 MG PO CAPS: 100.0000 mg | ORAL_CAPSULE | Freq: Three times a day (TID) | ORAL | 0 refills | Status: DC

## 2016-08-26 MED ORDER — ALBUTEROL SULFATE HFA 108 (90 BASE) MCG/ACT IN AERS: 2.0000 | INHALATION_SPRAY | RESPIRATORY_TRACT | 0 refills | Status: AC | PRN

## 2016-08-26 MED ORDER — DOXYCYCLINE HYCLATE 100 MG PO CAPS: 100.0000 mg | ORAL_CAPSULE | Freq: Two times a day (BID) | ORAL | 0 refills | Status: AC

## 2016-08-26 NOTE — ED Provider Notes (Signed)
[facility]    CSN: [id number] Arrival date & time: 08/26/16  1204     History   Chief Complaint Chief Complaint  Patient presents with  . Cough    HPI Paul [name] is a 50 y.o. male.   HPI  He is a 50 year old man here for evaluation of cough. His symptoms started abruptly Wednesday evening with fevers, body aches, cough, and congestion. He states he felt so bad it was hard to get out of the bed through Friday. His appetite has started to come back in the last 2 days. Yesterday, he felt better. This morning, he had a lot of congestion in his chest to the point where it made it difficult for him to breathe. He also reports some wheezing. No vomiting. He continues to report subjective fevers at night.  Past Medical History:  Diagnosis Date  . Allergy   . Eczema   . Hypertension     Patient Active Problem List   Diagnosis Date Noted  . Eczema 10/12/2013  . HTN (hypertension) 10/12/2013    Past Surgical History:  Procedure Laterality Date  . KNEE SURGERY         Home Medications    Prior to Admission medications   Medication Sig Start Date End Date Taking? Authorizing Provider  albuterol (PROVENTIL HFA;VENTOLIN HFA) 108 (90 Base) MCG/ACT inhaler Inhale 2 puffs into the lungs every 4 (four) hours as needed for wheezing or shortness of breath. 08/26/16   Charm RingsErin J Aramis Zobel, MD  amLODipine (NORVASC) 5 MG tablet Take 1 tablet (5 mg total) by mouth daily. 08/26/16   Charm RingsErin J Kemo Spruce, MD  benzonatate (TESSALON) 100 MG capsule Take 1 capsule (100 mg total) by mouth every 8 (eight) hours. 08/26/16   Charm RingsErin J Alcide Memoli, MD  doxycycline (VIBRAMYCIN) 100 MG capsule Take 1 capsule (100 mg total) by mouth 2 (two) times daily. 08/26/16 09/05/16  Charm RingsErin J Cort Dragoo, MD  losartan (COZAAR) 50 MG tablet Take 1 tablet (50 mg total) by mouth daily. 08/26/16   Charm RingsErin J Jocelyne Reinertsen, MD  predniSONE (DELTASONE) 50 MG tablet Take 1 pill daily for 5 days. 08/26/16   Charm RingsErin J Malani Lees, MD  triamcinolone cream  (KENALOG) 0.1 % Apply 1 application topically 2 (two) times daily. 10/12/13   Gwenlyn FoundJessica C Copland, MD  triamcinolone cream (KENALOG) 0.1 % Apply 1 application topically 2 (two) times daily. 04/12/16   Tharon AquasFrank C Patrick, PA    Family History Family History  Problem Relation Age of Onset  . Hypertension Father     Social History Social History  Substance Use Topics  . Smoking status: Current Every Day Smoker  . Smokeless tobacco: Never Used  . Alcohol use Yes     Allergies   Penicillins   Review of Systems Review of Systems As in history of present illness  Physical Exam Triage Vital Signs ED Triage Vitals  Enc Vitals Group     BP 08/26/16 1227 (!) 164/126     Pulse Rate 08/26/16 1227 102     Resp 08/26/16 1227 18     Temp 08/26/16 1227 98.2 F (36.8 C)     Temp src --      SpO2 08/26/16 1227 95 %     Weight --      Height --      Head Circumference --      Peak Flow --      Pain Score 08/26/16 1228 8     Pain Loc --  Pain Edu? --      Excl. in GC? --    No data found.   Updated Vital Signs BP (!) 164/126   Pulse 102   Temp 98.2 F (36.8 C)   Resp 18   SpO2 95%   Visual Acuity Right Eye Distance:   Left Eye Distance:   Bilateral Distance:    Right Eye Near:   Left Eye Near:    Bilateral Near:     Physical Exam  Constitutional: He appears well-developed and well-nourished. No distress.  HENT:  Nose: Nose normal.  Mouth/Throat: No oropharyngeal exudate.  Minor oropharyngeal erythema.  Neck: Neck supple.  Cardiovascular: Normal rate, regular rhythm and normal heart sounds.   No murmur heard. Pulmonary/Chest: Effort normal. No respiratory distress. He has wheezes (scattered expiratory). He has no rales.  He has rhonchi, primarily in the right lung fields.  Lymphadenopathy:    He has no cervical adenopathy.     UC Treatments / Results  Labs (all labs ordered are listed, but only abnormal results are displayed) Labs Reviewed - No data to  display  EKG  EKG Interpretation None       Radiology Dg Chest 2 View  Result Date: 08/26/2016 CLINICAL DATA:  Cough, fever, chills and shortness of breath for 5 days. EXAM: CHEST  2 VIEW COMPARISON:  None. FINDINGS: The cardiomediastinal silhouette is unremarkable. Mild peribronchial thickening is identified. There is no evidence of focal airspace disease, pulmonary edema, suspicious pulmonary nodule/mass, pleural effusion, or pneumothorax. No acute bony abnormalities are identified. IMPRESSION: Mild peribronchial thickening which may represent acute bronchitis, but is of uncertain chronicity. Electronically Signed   By: Harmon PierJeffrey  Hu M.D.   On: 08/26/2016 13:04    Procedures Procedures (including critical care time)  Medications Ordered in UC Medications - No data to display   Initial Impression / Assessment and Plan / UC Course  I have reviewed the triage vital signs and the nursing notes.  Pertinent labs & imaging results that were available during my care of the patient were reviewed by me and considered in my medical decision making (see chart for details).  Clinical Course     It sounds like he had the flu, and now has developed bronchitis. Treat with doxycycline and prednisone. Albuterol as needed for wheezing. Tessalon as needed for cough. Follow-up as needed.  Final Clinical Impressions(s) / UC Diagnoses   Final diagnoses:  Acute bronchitis, unspecified organism    New Prescriptions New Prescriptions   ALBUTEROL (PROVENTIL HFA;VENTOLIN HFA) 108 (90 BASE) MCG/ACT INHALER    Inhale 2 puffs into the lungs every 4 (four) hours as needed for wheezing or shortness of breath.   BENZONATATE (TESSALON) 100 MG CAPSULE    Take 1 capsule (100 mg total) by mouth every 8 (eight) hours.   DOXYCYCLINE (VIBRAMYCIN) 100 MG CAPSULE    Take 1 capsule (100 mg total) by mouth 2 (two) times daily.   PREDNISONE (DELTASONE) 50 MG TABLET    Take 1 pill daily for 5 days.     Charm RingsErin J  Dallis Darden, MD 08/26/16 [id number]

## 2016-08-26 NOTE — ED Triage Notes (Signed)
Pt here for URI symptoms.  

## 2016-08-26 NOTE — Discharge Instructions (Signed)
You likely have a post flu bronchitis. Take doxycycline and prednisone as prescribed. Use tessalon as needed for cough. Use the albuterol every 4 hours as needed for wheezing or cough. You should see improvement in the next 2-3 days. If you develop fevers, difficulty breathing, or are just not getting better, please come back or go to the emergency room.

## 2016-12-24 ENCOUNTER — Ambulatory Visit (INDEPENDENT_AMBULATORY_CARE_PROVIDER_SITE_OTHER): Payer: BLUE CROSS/BLUE SHIELD | Admitting: Family Medicine

## 2016-12-24 ENCOUNTER — Ambulatory Visit: Payer: Self-pay

## 2016-12-24 VITALS — BP 158/88 | HR 98 | Temp 98.3°F | Resp 16 | Ht 67.5 in | Wt 190.4 lb

## 2016-12-24 DIAGNOSIS — M79661 Pain in right lower leg: Secondary | ICD-10-CM

## 2016-12-24 DIAGNOSIS — L309 Dermatitis, unspecified: Secondary | ICD-10-CM

## 2016-12-24 MED ORDER — METHYLPREDNISOLONE ACETATE 40 MG/ML IJ SUSP
40.0000 mg | Freq: Once | INTRAMUSCULAR | Status: AC
  Administered 2016-12-24: 40 mg via INTRAMUSCULAR

## 2016-12-24 MED ORDER — TRIAMCINOLONE ACETONIDE 0.1 % EX CREA: 1.0000 "application " | TOPICAL_CREAM | Freq: Two times a day (BID) | CUTANEOUS | 0 refills | Status: AC

## 2016-12-24 NOTE — Progress Notes (Signed)
Subjective:    Patient ID: Paul [name], male    DOB: 1966-10-02, 51 y.o.   MRN: [id number]  Chief Complaint  Patient presents with  . Allergies    flare up  x 3 days since the weekend  . Eczema  . Medication Refill    kenalog cream    PCP: No PCP Per Patient  HPI  This is a 51 y.o. male who is presenting with allergies and eczema. He has some spots on his face that flare up on his face. These symptoms started this weekend and got worse over the weekend. He takes Claritin daily. Takes triamcinolone on a regular basis. His trigger is pollen. Denies any fevers. Reports some chills. No recent travel. No recent illness. Has had eczema his whole. Has seen a dermatologist, [facility] dermatology.   Having tightness in his right calf and is worse when he walks up and down stairs. Symptoms are staying the same. Occurring for the month. Has never had any injury. No inciting event. Having pain in the musculotendinous junction of the achilles and calf. Pain after 5 or 6 minutes of walking. Pain is resolved with resting. Intermittent in nature. Hasn't tried anything for it. Has taken tylenol. No prior history of similar symptoms.   Review of Systems  ROS: No unexpected weight loss, fever, chills, swelling, instability, numbness/tingling, redness, otherwise see HPI   PMH: HTN Surgical: knee surgery Shx: current tobacco user, occasional alcohol user  Fhx: none Allergies: reports allergy to taking prednisone but can taken IM steroids.   Patient Active Problem List   Diagnosis Date Noted  . Right calf pain 12/25/2016  . Eczema 10/12/2013  . HTN (hypertension) 10/12/2013     Prior to Admission medications   Medication Sig Start Date End Date Taking? Authorizing Provider  triamcinolone cream (KENALOG) 0.1 % Apply 1 application topically 2 (two) times daily. 10/12/13  Yes Gwenlyn Found Copland, MD  albuterol (PROVENTIL HFA;VENTOLIN HFA) 108 (90 Base) MCG/ACT inhaler Inhale 2 puffs into the  lungs every 4 (four) hours as needed for wheezing or shortness of breath. Patient not taking: Reported on 12/24/2016 08/26/16   Charm Rings, MD  amLODipine (NORVASC) 5 MG tablet Take 1 tablet (5 mg total) by mouth daily. Patient not taking: Reported on 12/24/2016 08/26/16   Charm Rings, MD  losartan (COZAAR) 50 MG tablet Take 1 tablet (50 mg total) by mouth daily. Patient not taking: Reported on 12/24/2016 08/26/16   Charm Rings, MD  predniSONE (DELTASONE) 50 MG tablet Take 1 pill daily for 5 days. Patient not taking: Reported on 12/24/2016 08/26/16   Charm Rings, MD  triamcinolone cream (KENALOG) 0.1 % Apply 1 application topically 2 (two) times daily. 04/12/16   Tharon Aquas, PA     Allergies  Allergen Reactions  . Penicillins       Objective:   Physical Exam BP (!) 158/88 (BP Location: Right Arm, Patient Position: Sitting, Cuff Size: Large)   Pulse 98   Temp 98.3 F (36.8 C) (Oral)   Resp 16   Ht 5' 7.5" (1.715 m)   Wt 190 lb 6.4 oz (86.4 kg)   SpO2 98%   BMI 29.38 kg/m  Gen: NAD, alert, cooperative with exam, well-appearing HEENT: NCAT, EOMI, clear conjunctiva, oropharynx clear, supple neck CV: no edema, capillary refill brisk  Resp:  non-labored Skin: skin lesions on his left temple and the right side of his neck. These are dry and scaly in  appearence. No streaking and oval in nature.  Neuro: no gross deficits.  Psych: alert and oriented RIght calf:  No obvious swelling or streaking  TTP the musculotendinous junction of the achilles and gastroc Normal plantar and dorsal flexion  Normal knee ROM  Normal ankle ROM  No swelling of the achilles or at its attachment  Thompon test with pronation  No limp  Neurovascularly intact     Assessment & Plan:   Eczema Reports this is a chronic for him and flares when the pollen is bad this time of year. Has seen dermatology in the past. Reports an intolerance to taking prednisone.  - IM depo today  - refilled kenalog  -  advised to establish care with new PCP  - given indications to return.   Right calf pain Symptoms consistent with a calf strain. Denies any specific injury.  - ACE wrap today  - provided HEP  - provided info obtain a hee lift  - if no improvement can consider referral to sports medicine for US exam.

## 2016-12-24 NOTE — Patient Instructions (Addendum)
Thank you for coming in,   Please follow up if your calf pain isn't improved with the modalities that we tried.   Please try to establish care with new primary doctor on your way out.   Please follow up if your eczema doesn't improve.    Please feel free to call with any questions or concerns at any time, at [phone]. --Dr. Jordan LikesSchmitz     IF you received an x-ray today, you will receive an invoice from [facility]. Please contact [facility] at [phone] with questions or concerns regarding your invoice.   IF you received labwork today, you will receive an invoice from [facility]. Please contact [facility] at [phone] with questions or concerns regarding your invoice.   Our billing staff will not be able to assist you with questions regarding bills from these companies.  You will be contacted with the lab results as soon as they are available. The fastest way to get your results is to activate your My Chart account. Instructions are located on the last page of this paperwork. If you have not heard from us regarding the results in 2 weeks, please contact this office.

## 2016-12-25 DIAGNOSIS — M79661 Pain in right lower leg: Secondary | ICD-10-CM | POA: Insufficient documentation

## 2016-12-25 NOTE — Assessment & Plan Note (Signed)
Reports this is a chronic for him and flares when the pollen is bad this time of year. Has seen dermatology in the past. Reports an intolerance to taking prednisone.  - IM depo today  - refilled kenalog  - advised to establish care with new PCP  - given indications to return.

## 2016-12-25 NOTE — Assessment & Plan Note (Signed)
Symptoms consistent with a calf strain. Denies any specific injury.  - ACE wrap today  - provided HEP  - provided info obtain a hee lift  - if no improvement can consider referral to sports medicine for US exam.

## 2017-02-06 ENCOUNTER — Ambulatory Visit (INDEPENDENT_AMBULATORY_CARE_PROVIDER_SITE_OTHER): Payer: BLUE CROSS/BLUE SHIELD | Admitting: Urgent Care

## 2017-02-06 ENCOUNTER — Encounter: Payer: Self-pay | Admitting: Urgent Care

## 2017-02-06 ENCOUNTER — Ambulatory Visit (HOSPITAL_COMMUNITY)
Admission: RE | Admit: 2017-02-06 | Discharge: 2017-02-06 | Disposition: A | Discharge status: Home / Self Care | Payer: BLUE CROSS/BLUE SHIELD | Source: Ambulatory Visit | Attending: Urgent Care | Admitting: Urgent Care

## 2017-02-06 VITALS — BP 163/101 | HR 76 | Temp 97.7°F | Resp 16 | Ht 67.5 in | Wt 185.2 lb

## 2017-02-06 DIAGNOSIS — I10 Essential (primary) hypertension: Secondary | ICD-10-CM | POA: Diagnosis not present

## 2017-02-06 DIAGNOSIS — R030 Elevated blood-pressure reading, without diagnosis of hypertension: Secondary | ICD-10-CM

## 2017-02-06 DIAGNOSIS — I739 Peripheral vascular disease, unspecified: Secondary | ICD-10-CM

## 2017-02-06 DIAGNOSIS — F17200 Nicotine dependence, unspecified, uncomplicated: Secondary | ICD-10-CM | POA: Diagnosis not present

## 2017-02-06 DIAGNOSIS — M79661 Pain in right lower leg: Secondary | ICD-10-CM

## 2017-02-06 MED ORDER — CILOSTAZOL 100 MG PO TABS: 100.0000 mg | ORAL_TABLET | Freq: Two times a day (BID) | ORAL | 2 refills | Status: AC

## 2017-02-06 MED ORDER — LOSARTAN POTASSIUM 50 MG PO TABS: 50.0000 mg | ORAL_TABLET | Freq: Every day | ORAL | 2 refills | Status: DC

## 2017-02-06 MED ORDER — AMLODIPINE BESYLATE 5 MG PO TABS: 5.0000 mg | ORAL_TABLET | Freq: Every day | ORAL | 2 refills | Status: AC

## 2017-02-06 NOTE — Progress Notes (Signed)
VASCULAR LAB PRELIMINARY  PRELIMINARY  PRELIMINARY  PRELIMINARY  Right lower extremity venous duplex completed.    Preliminary report:  Right:  No evidence of DVT, superficial thrombosis, or Baker's cyst.  Vannie Hochstetler, RVS 02/06/2017, 12:19 PM

## 2017-02-06 NOTE — Patient Instructions (Addendum)
Intermittent Claudication Intermittent claudication is pain in your leg that occurs when you walk or exercise and goes away when you rest. The pain can occur in one or both legs. What are the causes? Intermittent claudication is caused by the buildup of plaque within the major arteries in the body (atherosclerosis). The plaque, which makes arteries stiff and narrow, prevents enough blood from reaching your leg muscles. The pain occurs when you walk or exercise because your muscles need more blood when you are moving and exercising. What increases the risk? Risk factors include:  A family history of atherosclerosis.  A personal history of stroke or heart disease.  Older age.  Being inactive or overweight.  Smoking cigarettes.  Having another health condition such as:  Diabetes.  High blood pressure.  High cholesterol. What are the signs or symptoms? Your hip or leg may:  Ache.  Cramp.  Feel tight.  Feel weak.  Feel heavy. Over time, you may feel pain in your calf, thigh, or hip. How is this diagnosed? Your health care provider may diagnose intermittent claudication based on your symptoms and medical history. Your health care provider may also do tests to learn more about your condition. These may include:  Blood tests.  An ultrasound.  Imaging tests such as angiography, magnetic resonance angiography (MRA), and computed tomography angiography (CTA). How is this treated? You may be treated for problems such as:  High blood pressure.  High cholesterol.  Diabetes. Other treatments may include:  Lifestyle changes such as:  Starting an exercise program.  Losing weight.  Quitting smoking.  Medicines to help restore blood flow through your legs.  Blood vessel surgery (angioplasty) to restore blood flow if your intermittent claudication is caused by severe peripheral artery disease. Follow these instructions at home:  Manage any other health conditions you  have.  Eat a diet low in saturated fats and calories to maintain a healthy weight.  Quit smoking, if you smoke.  Take medicines only as directed by your health care provider.  If your health care provider recommended an exercise program for you, follow it as directed. Your exercise program may involve:  Walking three or more times a week.  Walking until you have certain symptoms of intermittent claudication.  Resting until symptoms go away.  Gradually increasing walking time to about 50 minutes a day. Contact a health care provider if: Your condition is not getting better or is getting worse. Get help right away if:  You have chest pain.  You have difficulty breathing.  You develop arm weakness.  You have trouble speaking.  Your face begins to droop. This information is not intended to replace advice given to you by your health care provider. Make sure you discuss any questions you have with your health care provider. Document Released: 07/27/2004 Document Revised: 03/01/2016 Document Reviewed: 12/31/2013 Elsevier Interactive Patient Education  2017 Elsevier Inc.   Cilostazol tablets What is this medicine? CILOSTAZOL (sil [place] sta zol) is used to treat the symptoms of intermittent claudication. This condition causes pain in the legs during walking, and goes away with rest. By improving blood flow, this medicine helps people with this condition walk longer distances without pain. This medicine may be used for other purposes; ask your health care provider or pharmacist if you have questions. COMMON BRAND NAME(S): Pletal What should I tell my health care provider before I take this medicine? They need to know if you have any of the following conditions: -bleeding disorder or hemophilia -  history of heart failure, heart attack, or other heart disease -an unusual or allergic reaction to cilostazol, other medicines, foods, dyes, or preservatives -pregnant or trying to get  pregnant -breast-feeding How should I use this medicine? Take this medicine by mouth with a full glass of water. Follow the directions on the prescription label. Take this medicine on an empty stomach, at least 30 minutes before or 2 hours after food. Do not take with food. Take your doses at regular intervals. Do not take your medicine more often than directed. Talk to your pediatrician regarding the use of this medicine in children. Special care may be needed. Overdosage: If you think you have taken too much of this medicine contact a poison control center or emergency room at once. NOTE: This medicine is only for you. Do not share this medicine with others. What if I miss a dose? If you miss a dose, take it as soon as you can. If it is almost time for your next dose, take only that dose. Do not take double or extra doses. What may interact with this medicine? Do not take this medicine with any of the following medications: -grapefruit juice This medicine may also interact with the following medications: -agents that prevent or treat blood clots like enoxaparin or warfarin -aspirin -diltiazem -erythromycin or clarithromycin -omeprazole -some medications for treating depression like fluoxetine, fluvoxamine, nefazodone -some medications for treating fungal infections like ketoconazole, fluconazole, itraconazole This list may not describe all possible interactions. Give your health care provider a list of all the medicines, herbs, non-prescription drugs, or dietary supplements you use. Also tell them if you smoke, drink alcohol, or use illegal drugs. Some items may interact with your medicine. What should I watch for while using this medicine? Visit your doctor or health care professional for regular checks on your progress. It may take 2 to 4 weeks for your condition to start to get better once you begin taking this medicine. In some people, it can take as long as 3 months for the condition to  get better. You may get drowsy or dizzy. Do not drive, use machinery, or do anything that needs mental alertness until you know how this drug affects you. Do not stand or sit up quickly, especially if you are an older patient. This reduces the risk of dizzy or fainting spells. Alcohol can make you more drowsy and dizzy. Avoid alcoholic drinks. Smoking may have effects on the circulation that may limit the benefits you receive from this medicine. You may wish to discuss how to stop smoking with your doctor or health care professional. If you are going to have surgery, tell your doctor or health care professional that you are taking this medicine. What side effects may I notice from receiving this medicine? Side effects that you should report to your doctor or health care professional as soon as possible: -allergic reactions like skin rash, itching or hives, swelling of the face, lips, or tongue -chest pain -fast, slow, or irregular heartbeat -signs and symptoms of bleeding such as bloody or black, tarry stools; red or dark-brown urine; spitting up blood or brown material that looks like coffee grounds; red spots on the skin; unusual bruising or bleeding from the eye, gums, or nose -swelling in the legs or ankles Side effects that usually do not require medical attention (report to your doctor or health care professional if they continue or are bothersome): -diarrhea -headache -nausea, or upset stomach This list may not describe all possible  side effects. Call your doctor for medical advice about side effects. You may report side effects to FDA at 1-800-FDA-1088. Where should I keep my medicine? Keep out of the reach of children. Store at room temperature between 15 and 30 degrees C (59 and 86 degrees F). Throw away any unused medicine after the expiration date. NOTE: This sheet is a summary. It may not cover all possible information. If you have questions about this medicine, talk to your doctor,  pharmacist, or health care provider.  2018 Elsevier/Gold Standard (2013-01-15 15:00:52)     Hypertension Hypertension, commonly called high blood pressure, is when the force of blood pumping through the arteries is too strong. The arteries are the blood vessels that carry blood from the heart throughout the body. Hypertension forces the heart to work harder to pump blood and may cause arteries to become narrow or stiff. Having untreated or uncontrolled hypertension can cause heart attacks, strokes, kidney disease, and other problems. A blood pressure reading consists of a higher number over a lower number. Ideally, your blood pressure should be below 120/80. The first ("top") number is called the systolic pressure. It is a measure of the pressure in your arteries as your heart beats. The second ("bottom") number is called the diastolic pressure. It is a measure of the pressure in your arteries as the heart relaxes. What are the causes? The cause of this condition is not known. What increases the risk? Some risk factors for high blood pressure are under your control. Others are not. Factors you can change   Smoking.  Having type 2 diabetes mellitus, high cholesterol, or both.  Not getting enough exercise or physical activity.  Being overweight.  Having too much fat, sugar, calories, or salt (sodium) in your diet.  Drinking too much alcohol. Factors that are difficult or impossible to change   Having chronic kidney disease.  Having a family history of high blood pressure.  Age. Risk increases with age.  Race. You may be at higher risk if you are African-American.  Gender. Men are at higher risk than women before age 101. After age 22, women are at higher risk than men.  Having obstructive sleep apnea.  Stress. What are the signs or symptoms? Extremely high blood pressure (hypertensive crisis) may cause:  Headache.  Anxiety.  Shortness of breath.  Nosebleed.  Nausea  and vomiting.  Severe chest pain.  Jerky movements you cannot control (seizures). How is this diagnosed? This condition is diagnosed by measuring your blood pressure while you are seated, with your arm resting on a surface. The cuff of the blood pressure monitor will be placed directly against the skin of your upper arm at the level of your heart. It should be measured at least twice using the same arm. Certain conditions can cause a difference in blood pressure between your right and left arms. Certain factors can cause blood pressure readings to be lower or higher than normal (elevated) for a short period of time:  When your blood pressure is higher when you are in a health care provider's office than when you are at home, this is called white coat hypertension. Most people with this condition do not need medicines.  When your blood pressure is higher at home than when you are in a health care provider's office, this is called masked hypertension. Most people with this condition may need medicines to control blood pressure. If you have a high blood pressure reading during one visit or you  have normal blood pressure with other risk factors:  You may be asked to return on a different day to have your blood pressure checked again.  You may be asked to monitor your blood pressure at home for 1 week or longer. If you are diagnosed with hypertension, you may have other blood or imaging tests to help your health care provider understand your overall risk for other conditions. How is this treated? This condition is treated by making healthy lifestyle changes, such as eating healthy foods, exercising more, and reducing your alcohol intake. Your health care provider may prescribe medicine if lifestyle changes are not enough to get your blood pressure under control, and if:  Your systolic blood pressure is above 130.  Your diastolic blood pressure is above 80. Your personal target blood pressure may  vary depending on your medical conditions, your age, and other factors. Follow these instructions at home: Eating and drinking   Eat a diet that is high in fiber and potassium, and low in sodium, added sugar, and fat. An example eating plan is called the DASH (Dietary Approaches to Stop Hypertension) diet. To eat this way:  Eat plenty of fresh fruits and vegetables. Try to fill half of your plate at each meal with fruits and vegetables.  Eat whole grains, such as whole wheat pasta, brown rice, or whole grain bread. Fill about one quarter of your plate with whole grains.  Eat or drink low-fat dairy products, such as skim milk or low-fat yogurt.  Avoid fatty cuts of meat, processed or cured meats, and poultry with skin. Fill about one quarter of your plate with lean proteins, such as fish, chicken without skin, beans, eggs, and tofu.  Avoid premade and processed foods. These tend to be higher in sodium, added sugar, and fat.  Reduce your daily sodium intake. Most people with hypertension should eat less than 1,500 mg of sodium a day.  Limit alcohol intake to no more than 1 drink a day for nonpregnant women and 2 drinks a day for men. One drink equals 12 oz of beer, 5 oz of wine, or 1 oz of hard liquor. Lifestyle   Work with your health care provider to maintain a healthy body weight or to lose weight. Ask what an ideal weight is for you.  Get at least 30 minutes of exercise that causes your heart to beat faster (aerobic exercise) most days of the week. Activities may include walking, swimming, or biking.  Include exercise to strengthen your muscles (resistance exercise), such as pilates or lifting weights, as part of your weekly exercise routine. Try to do these types of exercises for 30 minutes at least 3 days a week.  Do not use any products that contain nicotine or tobacco, such as cigarettes and e-cigarettes. If you need help quitting, ask your health care provider.  Monitor your  blood pressure at home as told by your health care provider.  Keep all follow-up visits as told by your health care provider. This is important. Medicines   Take over-the-counter and prescription medicines only as told by your health care provider. Follow directions carefully. Blood pressure medicines must be taken as prescribed.  Do not skip doses of blood pressure medicine. Doing this puts you at risk for problems and can make the medicine less effective.  Ask your health care provider about side effects or reactions to medicines that you should watch for. Contact a health care provider if:  You think you are having a  reaction to a medicine you are taking.  You have headaches that keep coming back (recurring).  You feel dizzy.  You have swelling in your ankles.  You have trouble with your vision. Get help right away if:  You develop a severe headache or confusion.  You have unusual weakness or numbness.  You feel faint.  You have severe pain in your chest or abdomen.  You vomit repeatedly.  You have trouble breathing. Summary  Hypertension is when the force of blood pumping through your arteries is too strong. If this condition is not controlled, it may put you at risk for serious complications.  Your personal target blood pressure may vary depending on your medical conditions, your age, and other factors. For most people, a normal blood pressure is less than 120/80.  Hypertension is treated with lifestyle changes, medicines, or a combination of both. Lifestyle changes include weight loss, eating a healthy, low-sodium diet, exercising more, and limiting alcohol. This information is not intended to replace advice given to you by your health care provider. Make sure you discuss any questions you have with your health care provider. Document Released: 09/24/2005 Document Revised: 08/22/2016 Document Reviewed: 08/22/2016 Elsevier Interactive Patient Education  2017 [employer].    IF you received an x-ray today, you will receive an invoice from [facility]. Please contact [facility] at [phone] with questions or concerns regarding your invoice.   IF you received labwork today, you will receive an invoice from [place]. Please contact [facility] at [phone] with questions or concerns regarding your invoice.   Our billing staff will not be able to assist you with questions regarding bills from these companies.  You will be contacted with the lab results as soon as they are available. The fastest way to get your results is to activate your My Chart account. Instructions are located on the last page of this paperwork. If you have not heard from [place] regarding the results in 2 weeks, please contact this office.    You are scheduled at [facility]ascular Department for Doppler Study of Right Leg @ 11am today 02/06/17 Register at the admitting entrance located next to [facility]

## 2017-02-06 NOTE — Progress Notes (Signed)
MRN: [id number] DOB: 10/29/1965  Subjective:   Paul [name] is a 51 y.o. male presenting for chief complaint of Leg Pain (x 2-3 months, foot goes numb at night )  Reports 3 month history of right lower leg pain. Pain is sharp, pressure type sensation, worse with walking, relieved with rest. His top foot goes numb at night, is relieved with hanging his feet off the bed. Smokes 2ppd, is not interested in quitting at all. Denies calf swelling, warmth, swelling. Denies dizziness, chronic headache, blurred vision, chest pain, shortness of breath, heart racing, palpitations, nausea, vomiting, abdominal pain, hematuria, lower leg swelling. He stopped taking his HTN medications on his own accord. He is not very concerned with his HTN management.  Paul [name] is not currently taking any medications. Also is allergic to penicillins. Paul [name]  has a past medical history of Allergy; Eczema; and Hypertension. Also  has a past surgical history that includes Knee surgery.  Objective:   Vitals: BP (!) 163/101 (BP Location: Right Arm, Patient Position: Sitting, Cuff Size: Large)   Pulse 76   Temp 97.7 F (36.5 C) (Oral)   Resp 16   Ht 5' 7.5" (1.715 m)   Wt 185 lb 3.2 oz (84 kg)   SpO2 96%   BMI 28.58 kg/m   BP Readings from Last 3 Encounters:  02/06/17 (!) 163/101  12/24/16 (!) 158/88  08/26/16 (!) 164/126   Physical Exam  Constitutional: He is oriented to person, place, and time. He appears well-developed and well-nourished.  HENT:  Dry mucous membranes.  Eyes: Right eye exhibits no discharge. Left eye exhibits no discharge. No scleral icterus.  Neck: Normal range of motion. Neck supple. No thyromegaly present.  Cardiovascular: Normal rate, regular rhythm and intact distal pulses.  Exam reveals no gallop and no friction rub.   No murmur heard. Pulmonary/Chest: No respiratory distress. He has no wheezes. He has no rales.  Abdominal: Soft. Bowel sounds are normal. He exhibits no distension and no  mass. There is no tenderness. There is no guarding.  Musculoskeletal:       Right lower leg: He exhibits tenderness (mildly positive Homann sign). He exhibits no bony tenderness, no swelling, no edema, no deformity and no laceration.       Right foot: There is normal range of motion, no tenderness, no bony tenderness, no swelling, normal capillary refill, no crepitus, no deformity and no laceration.  Right Dorsalis Pedis 1+, right Posterior Tibial 0.  Neurological: He is alert and oriented to person, place, and time. He displays normal reflexes.  Sensation intact.  Skin: Skin is warm and dry. Capillary refill takes less than 2 seconds.   Assessment and Plan :   1. Pain in right lower leg 2. Claudication of right lower extremity (HCC) - Arterial U/S is pending. His symptoms and history are consistent with claudication. Counseled patient on the diagnosis. Will have patient start cilostazol. Labs pending. Counseled patient on potential for adverse effects with medications prescribed today, she verbalized understanding.  - cilostazol (PLETAL) 100 MG tablet; Take 1 tablet (100 mg total) by mouth 2 (two) times daily.  Dispense: 60 tablet; Refill: 2 - Ambulatory referral to Vascular Surgery - VAS [place] LOWER EXTREMITY VENOUS (DVT); Future  3. Tobacco use disorder - Will revisit this at his next OV. Today, he is not interested in quitting.  4. Essential hypertension 5. Elevated blood pressure reading - Patient reluctantly agreed to restarting amlodipine, losartan today. He refused bloodwork but will have this done in  4 weeks.  Wallis Bamberg, PA-C [facility] at [facility] [phone] 02/06/2017  9:44 AM

## 2017-02-09 ENCOUNTER — Encounter: Payer: Self-pay | Admitting: Urgent Care

## 2017-02-09 ENCOUNTER — Other Ambulatory Visit: Payer: Self-pay | Admitting: Urgent Care

## 2017-02-09 ENCOUNTER — Ambulatory Visit (INDEPENDENT_AMBULATORY_CARE_PROVIDER_SITE_OTHER): Payer: BLUE CROSS/BLUE SHIELD | Admitting: Urgent Care

## 2017-02-09 ENCOUNTER — Other Ambulatory Visit
Admission: RE | Admit: 2017-02-09 | Discharge: 2017-02-09 | Disposition: A | Discharge status: Home / Self Care | Payer: BLUE CROSS/BLUE SHIELD | Source: Other Acute Inpatient Hospital | Attending: Urgent Care | Admitting: Urgent Care

## 2017-02-09 VITALS — BP 160/98 | HR 92 | Resp 14 | Ht 67.0 in | Wt 188.0 lb

## 2017-02-09 DIAGNOSIS — M79604 Pain in right leg: Secondary | ICD-10-CM

## 2017-02-09 DIAGNOSIS — F17200 Nicotine dependence, unspecified, uncomplicated: Secondary | ICD-10-CM | POA: Diagnosis not present

## 2017-02-09 DIAGNOSIS — M79661 Pain in right lower leg: Secondary | ICD-10-CM | POA: Diagnosis present

## 2017-02-09 DIAGNOSIS — R200 Anesthesia of skin: Secondary | ICD-10-CM

## 2017-02-09 LAB — CK: Total CK: 179 U/L (ref 49–397)

## 2017-02-09 LAB — POCT URINALYSIS DIP (MANUAL ENTRY)
BILIRUBIN UA: NEGATIVE mg/dL
Bilirubin, UA: NEGATIVE
Blood, UA: NEGATIVE
Glucose, UA: NEGATIVE mg/dL
LEUKOCYTES UA: NEGATIVE
Nitrite, UA: NEGATIVE
PH UA: 5 (ref 5.0–8.0)
PROTEIN UA: NEGATIVE mg/dL
Urobilinogen, UA: 0.2 E.U./dL

## 2017-02-09 MED ORDER — HYDROXYZINE HCL 50 MG PO TABS
50.0000 mg | ORAL_TABLET | Freq: Every day | ORAL | 0 refills | Status: DC
Start: 2017-02-09 — End: 2017-02-20

## 2017-02-09 MED ORDER — TRAMADOL HCL 50 MG PO TABS: 50.0000 mg | ORAL_TABLET | Freq: Three times a day (TID) | ORAL | 0 refills | Status: AC | PRN

## 2017-02-09 NOTE — Patient Instructions (Addendum)
We will take you out of work until we can see whether or not cilostazol will start helping. Please keep your appointments for the U/S and with the vascular surgeon.     Hypertension Hypertension, commonly called high blood pressure, is when the force of blood pumping through the arteries is too strong. The arteries are the blood vessels that carry blood from the heart throughout the body. Hypertension forces the heart to work harder to pump blood and may cause arteries to become narrow or stiff. Having untreated or uncontrolled hypertension can cause heart attacks, strokes, kidney disease, and other problems. A blood pressure reading consists of a higher number over a lower number. Ideally, your blood pressure should be below 120/80. The first ("top") number is called the systolic pressure. It is a measure of the pressure in your arteries as your heart beats. The second ("bottom") number is called the diastolic pressure. It is a measure of the pressure in your arteries as the heart relaxes. What are the causes? The cause of this condition is not known. What increases the risk? Some risk factors for high blood pressure are under your control. Others are not. Factors you can change   Smoking.  Having type 2 diabetes mellitus, high cholesterol, or both.  Not getting enough exercise or physical activity.  Being overweight.  Having too much fat, sugar, calories, or salt (sodium) in your diet.  Drinking too much alcohol. Factors that are difficult or impossible to change   Having chronic kidney disease.  Having a family history of high blood pressure.  Age. Risk increases with age.  Race. You may be at higher risk if you are African-American.  Gender. Men are at higher risk than women before age 56. After age 28, women are at higher risk than men.  Having obstructive sleep apnea.  Stress. What are the signs or symptoms? Extremely high blood pressure (hypertensive crisis) may  cause:  Headache.  Anxiety.  Shortness of breath.  Nosebleed.  Nausea and vomiting.  Severe chest pain.  Jerky movements you cannot control (seizures). How is this diagnosed? This condition is diagnosed by measuring your blood pressure while you are seated, with your arm resting on a surface. The cuff of the blood pressure monitor will be placed directly against the skin of your upper arm at the level of your heart. It should be measured at least twice using the same arm. Certain conditions can cause a difference in blood pressure between your right and left arms. Certain factors can cause blood pressure readings to be lower or higher than normal (elevated) for a short period of time:  When your blood pressure is higher when you are in a health care provider's office than when you are at home, this is called white coat hypertension. Most people with this condition do not need medicines.  When your blood pressure is higher at home than when you are in a health care provider's office, this is called masked hypertension. Most people with this condition may need medicines to control blood pressure. If you have a high blood pressure reading during one visit or you have normal blood pressure with other risk factors:  You may be asked to return on a different day to have your blood pressure checked again.  You may be asked to monitor your blood pressure at home for 1 week or longer. If you are diagnosed with hypertension, you may have other blood or imaging tests to help your health care provider  understand your overall risk for other conditions. How is this treated? This condition is treated by making healthy lifestyle changes, such as eating healthy foods, exercising more, and reducing your alcohol intake. Your health care provider may prescribe medicine if lifestyle changes are not enough to get your blood pressure under control, and if:  Your systolic blood pressure is above 130.  Your  diastolic blood pressure is above 80. Your personal target blood pressure may vary depending on your medical conditions, your age, and other factors. Follow these instructions at home: Eating and drinking   Eat a diet that is high in fiber and potassium, and low in sodium, added sugar, and fat. An example eating plan is called the DASH (Dietary Approaches to Stop Hypertension) diet. To eat this way:  Eat plenty of fresh fruits and vegetables. Try to fill half of your plate at each meal with fruits and vegetables.  Eat whole grains, such as whole wheat pasta, brown rice, or whole grain bread. Fill about one quarter of your plate with whole grains.  Eat or drink low-fat dairy products, such as skim milk or low-fat yogurt.  Avoid fatty cuts of meat, processed or cured meats, and poultry with skin. Fill about one quarter of your plate with lean proteins, such as fish, chicken without skin, beans, eggs, and tofu.  Avoid premade and processed foods. These tend to be higher in sodium, added sugar, and fat.  Reduce your daily sodium intake. Most people with hypertension should eat less than 1,500 mg of sodium a day.  Limit alcohol intake to no more than 1 drink a day for nonpregnant women and 2 drinks a day for men. One drink equals 12 oz of beer, 5 oz of wine, or 1 oz of hard liquor. Lifestyle   Work with your health care provider to maintain a healthy body weight or to lose weight. Ask what an ideal weight is for you.  Get at least 30 minutes of exercise that causes your heart to beat faster (aerobic exercise) most days of the week. Activities may include walking, swimming, or biking.  Include exercise to strengthen your muscles (resistance exercise), such as pilates or lifting weights, as part of your weekly exercise routine. Try to do these types of exercises for 30 minutes at least 3 days a week.  Do not use any products that contain nicotine or tobacco, such as cigarettes and e-cigarettes.  If you need help quitting, ask your health care provider.  Monitor your blood pressure at home as told by your health care provider.  Keep all follow-up visits as told by your health care provider. This is important. Medicines   Take over-the-counter and prescription medicines only as told by your health care provider. Follow directions carefully. Blood pressure medicines must be taken as prescribed.  Do not skip doses of blood pressure medicine. Doing this puts you at risk for problems and can make the medicine less effective.  Ask your health care provider about side effects or reactions to medicines that you should watch for. Contact a health care provider if:  You think you are having a reaction to a medicine you are taking.  You have headaches that keep coming back (recurring).  You feel dizzy.  You have swelling in your ankles.  You have trouble with your vision. Get help right away if:  You develop a severe headache or confusion.  You have unusual weakness or numbness.  You feel faint.  You have severe pain  in your chest or abdomen.  You vomit repeatedly.  You have trouble breathing. Summary  Hypertension is when the force of blood pumping through your arteries is too strong. If this condition is not controlled, it may put you at risk for serious complications.  Your personal target blood pressure may vary depending on your medical conditions, your age, and other factors. For most people, a normal blood pressure is less than 120/80.  Hypertension is treated with lifestyle changes, medicines, or a combination of both. Lifestyle changes include weight loss, eating a healthy, low-sodium diet, exercising more, and limiting alcohol. This information is not intended to replace advice given to you by your health care provider. Make sure you discuss any questions you have with your health care provider. Document Released: 09/24/2005 Document Revised: 08/22/2016 Document  Reviewed: 08/22/2016 Elsevier Interactive Patient Education  2017 [employer].

## 2017-02-09 NOTE — Progress Notes (Signed)
    MRN: [id number] DOB: [birth year]  Subjective:   Paul [name] is a 51 y.o. male presenting for follow up on right lower leg. Patient started taking cilostazol 1-2 days ago. He has not noticed any changes. He continues to have severe claudication and is disrupting his sleep and concentration at work. Feels like he is overcompensating with the rest of his body and is having a very difficult time keeping up.    Paul [name] has a current medication list which includes the following prescription(s): albuterol, amlodipine, cilostazol, losartan, and triamcinolone cream. Also is allergic to penicillins. Paul [name]  has a past medical history of Allergy; Eczema; and Hypertension. Also  has a past surgical history that includes Knee surgery.  Objective:   Vitals: BP (!) 160/98   Pulse 92   Resp 14   Ht 5\' 7"  (1.702 m)   Wt 188 lb (85.3 kg)   SpO2 97%   BMI 29.44 kg/m   BP Readings from Last 3 Encounters:  02/09/17 (!) 160/98  02/06/17 (!) 163/101  12/24/16 (!) 158/88   Physical Exam  Constitutional: He is oriented to person, place, and time. He appears well-developed and well-nourished.  Cardiovascular: Normal rate, regular rhythm and intact distal pulses.  Exam reveals no gallop and no friction rub.   No murmur heard. Pulmonary/Chest: No respiratory distress. He has no wheezes. He has no rales.  Musculoskeletal:       Right lower leg: He exhibits tenderness. He exhibits no bony tenderness, no swelling, no edema, no deformity and no laceration.       Legs: Neurological: He is alert and oriented to person, place, and time.   Results for orders placed or performed in visit on 02/09/17 (from the past 24 hour(s))  POCT urinalysis dipstick     Status: Abnormal   Collection Time: 02/09/17  2:32 PM  Result Value Ref Range   Color, UA yellow yellow   Clarity, UA clear clear   Glucose, UA negative negative mg/dL   Bilirubin, UA negative negative   Ketones, POC UA negative negative mg/dL   Spec  Grav, UA <=4.098<=1.005 (A) 1.010 - 1.025   Blood, UA negative negative   pH, UA 5.0 5.0 - 8.0   Protein Ur, POC negative negative mg/dL   Urobilinogen, UA 0.2 0.2 or 1.0 E.U./dL   Nitrite, UA Negative Negative   Leukocytes, UA Negative Negative    Assessment and Plan :   1. Right calf pain 2. Acute pain of right lower extremity 3. Numbness of right foot - Labs pending, continue cilostazol. Will hold from work. Continue aggressive hydration. Use Tramadol to help with pain. CBC was not obtained since our phlebotomist did not draw tube despite standing order. Patient was unwilling to have more blood drawn.  4. Tobacco use disorder - Encouraged continued efforts at smoking cessation, patient will consider nicotine patches.  Wallis BambergMario Kabella Cassidy, PA-C [facility] [phone] 02/09/2017 1:47 PM

## 2017-02-11 LAB — COMPREHENSIVE METABOLIC PANEL
A/G RATIO: 1.8 (ref 1.2–2.2)
ALK PHOS: 47 IU/L (ref 39–117)
ALT: 16 IU/L (ref 0–44)
AST: 16 IU/L (ref 0–40)
Albumin: 4.6 g/dL (ref 3.5–4.8)
BILIRUBIN TOTAL: 0.4 mg/dL (ref 0.0–1.2)
BUN/Creatinine Ratio: 11 (ref 10–24)
BUN: 10 mg/dL (ref 8–27)
CHLORIDE: 100 mmol/L (ref 96–106)
CO2: 21 mmol/L (ref 18–29)
Calcium: 9.2 mg/dL (ref 8.6–10.2)
Creatinine, Ser: 0.94 mg/dL (ref 0.76–1.27)
GFR calc Af Amer: 89 mL/min/{1.73_m2} (ref >59)
GFR calc non Af Amer: 77 mL/min/{1.73_m2} (ref >59)
GLOBULIN, TOTAL: 2.5 g/dL (ref 1.5–4.5)
Glucose: 96 mg/dL (ref 65–99)
Potassium: 4.3 mmol/L (ref 3.5–5.2)
SODIUM: 138 mmol/L (ref 134–144)
Total Protein: 7.1 g/dL (ref 6.0–8.5)

## 2017-02-11 LAB — SEDIMENTATION RATE

## 2017-02-11 LAB — CK: CK TOTAL: 179 U/L (ref 49–397)

## 2017-02-12 ENCOUNTER — Other Ambulatory Visit: Payer: Self-pay | Admitting: *Deleted

## 2017-02-12 DIAGNOSIS — I739 Peripheral vascular disease, unspecified: Secondary | ICD-10-CM

## 2017-02-13 ENCOUNTER — Ambulatory Visit (HOSPITAL_COMMUNITY)
Admission: RE | Admit: 2017-02-13 | Discharge: 2017-02-13 | Disposition: A | Discharge status: Home / Self Care | Payer: BLUE CROSS/BLUE SHIELD | Source: Ambulatory Visit | Attending: Vascular Surgery | Admitting: Vascular Surgery

## 2017-02-13 DIAGNOSIS — M79661 Pain in right lower leg: Secondary | ICD-10-CM | POA: Diagnosis not present

## 2017-02-13 DIAGNOSIS — I70201 Unspecified atherosclerosis of native arteries of extremities, right leg: Secondary | ICD-10-CM | POA: Insufficient documentation

## 2017-02-13 LAB — VAS US LOWER EXTREMITY ARTERIAL DUPLEX
RIGHT ANT DIST TIBAL SYS PSV: 14 cm/s
RIGHT POST TIB DIST SYS: -27 cm/s
RPERPSV: -10 cm/s
RSFDPSV: -50 cm/s
RSFMPSV: -39 cm/s
RSFPPSV: -50 cm/s

## 2017-02-19 ENCOUNTER — Encounter: Payer: Self-pay | Admitting: Family

## 2017-02-20 ENCOUNTER — Ambulatory Visit (INDEPENDENT_AMBULATORY_CARE_PROVIDER_SITE_OTHER): Payer: BLUE CROSS/BLUE SHIELD | Admitting: Urgent Care

## 2017-02-20 ENCOUNTER — Encounter: Payer: Self-pay | Admitting: Urgent Care

## 2017-02-20 VITALS — BP 158/93 | HR 93 | Temp 98.5°F | Resp 16 | Ht 67.0 in | Wt 187.0 lb

## 2017-02-20 DIAGNOSIS — I739 Peripheral vascular disease, unspecified: Secondary | ICD-10-CM | POA: Diagnosis not present

## 2017-02-20 DIAGNOSIS — M79661 Pain in right lower leg: Secondary | ICD-10-CM

## 2017-02-20 DIAGNOSIS — R030 Elevated blood-pressure reading, without diagnosis of hypertension: Secondary | ICD-10-CM | POA: Diagnosis not present

## 2017-02-20 DIAGNOSIS — I10 Essential (primary) hypertension: Secondary | ICD-10-CM | POA: Diagnosis not present

## 2017-02-20 DIAGNOSIS — R200 Anesthesia of skin: Secondary | ICD-10-CM | POA: Diagnosis not present

## 2017-02-20 DIAGNOSIS — F17200 Nicotine dependence, unspecified, uncomplicated: Secondary | ICD-10-CM | POA: Diagnosis not present

## 2017-02-20 MED ORDER — PREGABALIN 75 MG PO CAPS: ORAL_CAPSULE | ORAL | 1 refills | Status: DC

## 2017-02-20 NOTE — Progress Notes (Signed)
   MRN: [id number] DOB: Jun 29, 1966  Subjective:   Paul [name] is a 51 y.o. male presenting for follow up on claudication. At his last OV, patient was still having symptoms of claudication despite starting cilostazol since 02/07/2017. He has gotten arterial U/S but I have not seen the report. Today, he reports ongoing leg pain, numbness and tingling. He is having difficulty with his sleep as a result. Also feels unsafe at work due to his pain and tiredness. He is becoming forgetful, limping, having a hard time completing his tasks as part of his strenuous work activity. Denies headache, chest pain, shob, heart racing, abdominal pain, diaphoresis, lower leg swelling, discoloration of his lower legs, cold lower leg. He was doing well with cutting back on his smoking but is back to smoking heavily.  Casimiro NeedleMichael has a current medication list which includes the following prescription(s): amlodipine, cilostazol, losartan, albuterol, hydroxyzine, tramadol, and triamcinolone cream. Also is allergic to penicillins. Casimiro NeedleMichael  has a past medical history of Allergy; Eczema; and Hypertension. Also  has a past surgical history that includes Knee surgery.  Objective:   Vitals: BP (!) 158/93   Pulse 93   Temp 98.5 F (36.9 C) (Oral)   Resp 16   Ht 5\' 7"  (1.702 m)   Wt 187 lb (84.8 kg)   SpO2 100%   BMI 29.29 kg/m   BP Readings from Last 3 Encounters:  02/20/17 (!) 158/93  02/09/17 (!) 160/98  02/06/17 (!) 163/101   Physical Exam  Constitutional: He is oriented to person, place, and time. He appears well-developed and well-nourished.  HENT:  Mouth/Throat: Oropharynx is clear and moist.  Cardiovascular: Normal rate, regular rhythm and intact distal pulses.  Exam reveals no gallop and no friction rub.   No murmur heard. Pulmonary/Chest: No respiratory distress. He has no wheezes. He has no rales.  Abdominal: Soft. Bowel sounds are normal. He exhibits no distension and no mass. There is no tenderness. There  is no guarding.  Musculoskeletal: He exhibits no edema.       Right lower leg: He exhibits tenderness (over area depicted). He exhibits no bony tenderness, no swelling, no edema, no deformity and no laceration.       Legs: Neurological: He is alert and oriented to person, place, and time.  Skin: Skin is warm and dry.   Assessment and Plan :   1. Essential hypertension 2. Elevated blood pressure reading - Increase losartan to 100mg  daily, maintain 5mg  amlodipine. Recheck in 2 weeks. Return-to-clinic precautions discussed, patient verbalized understanding.   3. Claudication of right lower extremity (HCC) 4. Numbness of right foot 5. Pain in right lower leg - Maintain cilostazol, add Lyrica. Patient will keep appt with vascular surgery on 02/27/2017.  6. Tobacco use disorder - Continue efforts at smoking cessation.  Wallis BambergMario Gershon Shorten, PA-C [facility] [phone] 02/20/2017 10:08 AM

## 2017-02-20 NOTE — Patient Instructions (Addendum)
Losartan - Take 100mg  losartan (2 tablets) per day. Keep taking 5mg  amlodipine (1 tablet) per day. Start with 75mg  (1 capsule) Lyrica at bedtime for 1 week. Then increase to 150mg  (2 capsules) at bedtime after that week.    Peripheral Vascular Disease Peripheral vascular disease (PVD) is a disease of the blood vessels that are not part of your heart and brain. A simple term for PVD is poor circulation. In most cases, PVD narrows the blood vessels that carry blood from your heart to the rest of your body. This can result in a decreased supply of blood to your arms, legs, and internal organs, like your stomach or kidneys. However, it most often affects a person's lower legs and feet. There are two types of PVD.  Organic PVD. This is the more common type. It is caused by damage to the structure of blood vessels.  Functional PVD. This is caused by conditions that make blood vessels contract and tighten (spasm). Without treatment, PVD tends to get worse over time. PVD can also lead to acute ischemic limb. This is when an arm or limb suddenly has trouble getting enough blood. This is a medical emergency. What are the causes? Each type of PVD has many different causes. The most common cause of PVD is buildup of a fatty material (plaque) inside of your arteries (atherosclerosis). Small amounts of plaque can break off from the walls of the blood vessels and become lodged in a smaller artery. This blocks blood flow and can cause acute ischemic limb. Other common causes of PVD include:  Blood clots that form inside of blood vessels.  Injuries to blood vessels.  Diseases that cause inflammation of blood vessels or cause blood vessel spasms.  Health behaviors and health history that increase your risk of developing PVD. What increases the risk? You may have a greater risk of PVD if you:  Have a family history of PVD.  Have certain medical conditions, including:  High  cholesterol.  Diabetes.  High blood pressure (hypertension).  Coronary heart disease.  Past problems with blood clots.  Past injury, such as burns or a broken bone. These may have damaged blood vessels in your limbs.  Buerger disease. This is caused by inflamed blood vessels in your hands and feet.  Some forms of arthritis.  Rare birth defects that affect the arteries in your legs.  Use tobacco.  Do not get enough exercise.  Are obese.  Are age 50 or older. What are the signs or symptoms? PVD may cause many different symptoms. Your symptoms depend on what part of your body is not getting enough blood. Some common signs and symptoms include:  Cramps in your lower legs. This may be a symptom of poor leg circulation (claudication).  Pain and weakness in your legs while you are physically active that goes away when you rest (intermittent claudication).  Leg pain when at rest.  Leg numbness, tingling, or weakness.  Coldness in a leg or foot, especially when compared with the other leg.  Skin or hair changes. These can include:  Hair loss.  Shiny skin.  Pale or bluish skin.  Thick toenails.  Inability to get or maintain an erection (erectile dysfunction). People with PVD are more prone to developing ulcers and sores on their toes, feet, or legs. These may take longer than normal to heal. How is this diagnosed? Your health care provider may diagnose PVD from your signs and symptoms. The health care provider will also do a  physical exam. You may have tests to find out what is causing your PVD and determine its severity. Tests may include:  Blood pressure recordings from your arms and legs and measurements of the strength of your pulses (pulse volume recordings).  Imaging studies using sound waves to take pictures of the blood flow through your blood vessels (Doppler ultrasound).  Injecting a dye into your blood vessels before having imaging studies using:  X-rays  (angiogram or arteriogram).  Computer-generated X-rays (CT angiogram).  A powerful electromagnetic field and a computer (magnetic resonance angiogram or MRA). How is this treated? Treatment for PVD depends on the cause of your condition and the severity of your symptoms. It also depends on your age 50. Underlying causes need to be treated and controlled. These include long-lasting (chronic) conditions, such as diabetes, high cholesterol, and high blood pressure. You may need to first try making lifestyle changes and taking medicines. Surgery may be needed if these do not work. Lifestyle changes may include:  Quitting smoking.  Exercising regularly.  Following a low-fat, low-cholesterol diet. Medicines may include:  Blood thinners to prevent blood clots.  Medicines to improve blood flow.  Medicines to improve your blood cholesterol levels. Surgical procedures may include:  A procedure that uses an inflated balloon to open a blocked artery and improve blood flow (angioplasty).  A procedure to put in a tube (stent) to keep a blocked artery open (stent implant).  Surgery to reroute blood flow around a blocked artery (peripheral bypass surgery).  Surgery to remove dead tissue from an infected wound on the affected limb.  Amputation. This is surgical removal of the affected limb. This may be necessary in cases of acute ischemic limb that are not improved through medical or surgical treatments. Follow these instructions at home:  Take medicines only as directed by your health care provider.  Do not use any tobacco products, including cigarettes, chewing tobacco, or electronic cigarettes. If you need help quitting, ask your health care provider.  Lose weight if you are overweight, and maintain a healthy weight as directed by your health care provider.  Eat a diet that is low in fat and cholesterol. If you need help, ask your health care provider.  Exercise regularly. Ask your health  care provider to suggest some good activities for you.  Use compression stockings or other mechanical devices as directed by your health care provider.  Take good care of your feet.  Wear comfortable shoes that fit well.  Check your feet often for any cuts or sores. Contact a health care provider if:  You have cramps in your legs while walking.  You have leg pain when you are at rest.  You have coldness in a leg or foot.  Your skin changes.  You have erectile dysfunction.  You have cuts or sores on your feet that are not healing. Get help right away if:  Your arm or leg turns cold and blue.  Your arms or legs become red, warm, swollen, painful, or numb.  You have chest pain or trouble breathing.  You suddenly have weakness in your face, arm, or leg.  You become very confused or lose the ability to speak.  You suddenly have a very bad headache or lose your vision. This information is not intended to replace advice given to you by your health care provider. Make sure you discuss any questions you have with your health care provider. Document Released: 11/01/2004 Document Revised: 03/01/2016 Document Reviewed: 03/04/2014 Elsevier Interactive Patient  Education  2017 [employer].     IF you received an x-ray today, you will receive an invoice from [facility]. Please contact [facility] at [phone] with questions or concerns regarding your invoice.   IF you received labwork today, you will receive an invoice from [facility]. Please contact [facility] at [phone] with questions or concerns regarding your invoice.   Our billing staff will not be able to assist you with questions regarding bills from these companies.  You will be contacted with the lab results as soon as they are available. The fastest way to get your results is to activate your My Chart account. Instructions are located on the last page of this paperwork. If you have not heard from [place]  regarding the results in 2 weeks, please contact this office.

## 2017-02-26 ENCOUNTER — Telehealth: Payer: Self-pay | Admitting: Family Medicine

## 2017-02-26 ENCOUNTER — Telehealth: Payer: Self-pay

## 2017-02-26 MED ORDER — LOSARTAN POTASSIUM 100 MG PO TABS: 100.0000 mg | ORAL_TABLET | Freq: Every day | ORAL | 0 refills | Status: AC

## 2017-02-26 MED ORDER — GABAPENTIN 300 MG PO CAPS: 300.0000 mg | ORAL_CAPSULE | Freq: Three times a day (TID) | ORAL | 3 refills | Status: AC

## 2017-02-26 NOTE — Telephone Encounter (Signed)
fyi

## 2017-02-26 NOTE — Telephone Encounter (Signed)
PT CALLING FOR LYRICA HAS TO BE PRIOR AUTHORIZE

## 2017-02-26 NOTE — Telephone Encounter (Signed)
Refill sent electronically to his pharmacy for losartan 100mg  tablets.

## 2017-02-26 NOTE — Telephone Encounter (Signed)
Let's try gabapentin 300mg  TID.

## 2017-02-26 NOTE — Telephone Encounter (Signed)
Called for PA and received a denial because the patient does not have diabetic neuropathy or fibromyalgia, however, gabapentin 300mg  is covered by plan no PA needed.  Please advise next step.

## 2017-02-26 NOTE — Telephone Encounter (Signed)
Spoke to patient and he stated that he has only one day left of Losartan 50mg  and can not get it filled until 03-08-17. He is curious as to what he should do?

## 2017-02-27 ENCOUNTER — Ambulatory Visit (INDEPENDENT_AMBULATORY_CARE_PROVIDER_SITE_OTHER): Payer: BLUE CROSS/BLUE SHIELD | Admitting: Vascular Surgery

## 2017-02-27 ENCOUNTER — Telehealth: Payer: Self-pay

## 2017-02-27 ENCOUNTER — Encounter: Payer: Self-pay | Admitting: Vascular Surgery

## 2017-02-27 VITALS — BP 155/95 | HR 86 | Temp 98.0°F | Resp 16 | Ht 67.0 in | Wt 187.0 lb

## 2017-02-27 DIAGNOSIS — I70219 Atherosclerosis of native arteries of extremities with intermittent claudication, unspecified extremity: Secondary | ICD-10-CM

## 2017-02-27 DIAGNOSIS — Z716 Tobacco abuse counseling: Secondary | ICD-10-CM

## 2017-02-27 NOTE — Telephone Encounter (Signed)
rx was sent

## 2017-02-27 NOTE — Telephone Encounter (Signed)
After reviewing chart I see that Wallis BambergMario Mani received my previous message and sent in a new RX for patient.  Tried to call him but his line "was not accepting calls at this time."

## 2017-02-27 NOTE — Progress Notes (Signed)
[name] name: Paul [name] MRN: [id number] DOB: 1966-03-04 Sex: male   REASON FOR CONSULT:    Claudication. Referred by Paul Bamberg, PA  HPI:   Paul [name] is a 51 y.o. male, who was referred for evaluation of right lower extremity claudication.  He noted Paul gradual onset of pain in his right calf which is brought on by ambulation and relieved with rest. There are no other aggravating or alleviating factors. He has no symptoms in his left lower extremity. He denies any significant hip or thigh claudication on Paul right. He does describe rest pain of Paul right foot. He also describes paresthesias in Paul right foot. He denies any history of nonhealing wounds.  His risk factors for peripheral vascular disease include hypertension and tobacco use. He smokes 2 packs per day. He denies any history of diabetes, hypercholesterolemia, or family history of premature cardiovascular disease.  I have reviewed Paul records that were sent from Paul referring office. Paul [name] has had leg pain for 2-3 months. Paul right foot goes numb. [name] smokes 2 packs per day and is not interested in quitting. Paul [name] is on cilostazol which was prescribed during Paul visit on 02/06/17.  Past Medical History:  Diagnosis Date  . Allergy   . Eczema   . Hypertension     Family History  Problem Relation Age of Onset  . Hypertension Father     SOCIAL HISTORY: Social History   Social History  . Marital status: Single    Spouse name: N/A  . Number of children: N/A  . Years of education: N/A   Occupational History  . Not on file.   Social History Main Topics  . Smoking status: Current Every Day Smoker    Packs/day: 2.00  . Smokeless tobacco: Never Used  . Alcohol use Yes  . Drug use: Unknown  . Sexual activity: Not on file   Other Topics Concern  . Not on file   Social History Narrative  . No narrative on file    Allergies  Allergen Reactions  . Penicillins     Current Outpatient  Prescriptions  Medication Sig Dispense Refill  . amLODipine (NORVASC) 5 MG tablet Take 1 tablet (5 mg total) by mouth daily. 30 tablet 2  . cilostazol (PLETAL) 100 MG tablet Take 1 tablet (100 mg total) by mouth 2 (two) times daily. 60 tablet 2  . losartan (COZAAR) 100 MG tablet Take 1 tablet (100 mg total) by mouth daily. 90 tablet 0  . traMADol (ULTRAM) 50 MG tablet Take 1 tablet (50 mg total) by mouth every 8 (eight) hours as needed. 30 tablet 0  . triamcinolone cream (KENALOG) 0.1 % Apply 1 application topically 2 (two) times daily. 453.6 g 0  . albuterol (PROVENTIL HFA;VENTOLIN HFA) 108 (90 Base) MCG/ACT inhaler Inhale 2 puffs into Paul lungs every 4 (four) hours as needed for wheezing or shortness of breath. ([name] not taking: Reported on 02/27/2017) 1 Inhaler 0  . gabapentin (NEURONTIN) 300 MG capsule Take 1 capsule (300 mg total) by mouth 3 (three) times daily. ([name] not taking: Reported on 02/27/2017) 90 capsule 3   No current facility-administered medications for this visit.     REVIEW OF SYSTEMS:  [X]  denotes positive finding, [ ]  denotes negative finding Cardiac  Comments:  Chest pain or chest pressure:    Shortness of breath upon exertion:    Short of breath when lying flat:    Irregular heart rhythm:  Vascular    Pain in calf, thigh, or hip brought on by ambulation: X   Pain in feet at night that wakes you up from your sleep:  X   Blood clot in your veins:    Leg swelling:         Pulmonary    Oxygen at home:    Productive cough:     Wheezing:         Neurologic    Sudden weakness in arms or legs:     Sudden numbness in arms or legs:     Sudden onset of difficulty speaking or slurred speech:    Temporary loss of vision in one eye:     Problems with dizziness:         Gastrointestinal    Blood in stool:     Vomited blood:         Genitourinary    Burning when urinating:     Blood in urine:        Psychiatric    Major depression:           Hematologic    Bleeding problems:    Problems with blood clotting too easily:        Skin    Rashes or ulcers:        Constitutional    Fever or chills:     PHYSICAL EXAM:   Vitals:   02/27/17 1046  BP: (!) 155/95  Pulse: 86  Resp: 16  Temp: 98 F (36.7 C)  TempSrc: Oral  SpO2: 93%  Weight: 187 lb (84.8 kg)  Height: 5\' 7"  (1.702 m)    GENERAL: Paul [name] is a well-nourished male, in no acute distress. Paul vital signs are documented above. CARDIAC: There is a regular rate and rhythm.  VASCULAR: I do not detect carotid bruits. On Paul right side, which is Paul symptomatic side, he has a palpable femoral pulse. I cannot palpate a popliteal or pedal pulses. On Paul left side, he has a palpable femoral, popliteal, and posterior tibial pulse. He has no significant lower extremity swelling. PULMONARY: There is good air exchange bilaterally without wheezing or rales. ABDOMEN: Soft and non-tender with normal pitched bowel sounds.  MUSCULOSKELETAL: There are no major deformities or cyanosis. NEUROLOGIC: No focal weakness or paresthesias are detected. SKIN: There are no ulcers or rashes noted. PSYCHIATRIC: Paul [name] has a normal affect.  DATA:    VENOUS DUPLEX: I have reviewed Paul venous duplex scan was done on 5-18. This showed no evidence of DVT or superficial thrombophlebitis of Paul right lower extremity.  RIGHT LOWER EXTREMITY ARTERIAL DUPLEX: I have reviewed Paul duplex that was done on 02/13/2017. This shows a triphasic common femoral artery waveform with a monophasic waveform from Paul superficial femoral artery distally. 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 ABIs were not done.  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 I spent more than 3 minutes on this conversation. We've also discussed Paul importance of getting on a  structured walking program. He is currently on cilostazol. I have explained given Paul severity of his symptoms I think it would be perfectly reasonable to proceed with arteriography to determine if he is a candidate for an endovascular approach to his superficial femoral artery occlusive disease or determine his options for revascularization. Currently he wishes to try conservative  treatment which I think is reasonable. I'll see him back in 3 months. I will obtain ABIs as these have not yet been done yet. If his symptoms progressed and he will call sooner and certainly we could schedule his arteriogram.  I have reviewed with Paul [name] Paul indications for arteriography. In addition, I have reviewed Paul potential complications of arteriography including but not limited to: Bleeding, arterial injury, arterial thrombosis, dye action, renal insufficiency, or other unpredictable medical problems. I have explained to Paul [name] that if we find disease amenable to angioplasty we could potentially address this at Paul same time. I have discussed Paul potential complications of angioplasty and stenting, including but not limited to: Bleeding, arterial thrombosis, arterial injury, dissection, or Paul need for surgical intervention.   Waverly Ferrariickson, Gennie E[facility]er [facility] [place] [facility] [phone]

## 2017-02-27 NOTE — Addendum Note (Signed)
Addended by: Burton ApleyPETTY, Lyrick Lagrand A on: 02/27/2017 04:09 PM   Modules accepted: Orders

## 2017-02-28 NOTE — Telephone Encounter (Signed)
Tried to call patient to notify him that a different medication, covered by his insurance had been sent to his pharmacy.  His VM was full and I could not leave a message.

## 2017-02-28 NOTE — Telephone Encounter (Signed)
Patient called and I told him that a different med had been called to pharmacy.  He was very Adult nurseappreciative.

## 2017-03-06 ENCOUNTER — Telehealth: Payer: Self-pay

## 2017-03-06 NOTE — Telephone Encounter (Signed)
lyrica not covered pa [id number] [phone]  cymbalta or gabepentin preferred. Please send new rx if appropriate

## 2017-03-07 NOTE — Telephone Encounter (Signed)
I already sent a script electronically for gabapentin a week ago.

## 2017-05-21 ENCOUNTER — Encounter: Payer: Self-pay | Admitting: Vascular Surgery

## 2017-05-29 ENCOUNTER — Encounter (HOSPITAL_COMMUNITY): Payer: BLUE CROSS/BLUE SHIELD

## 2017-05-29 ENCOUNTER — Ambulatory Visit: Payer: BLUE CROSS/BLUE SHIELD | Admitting: Vascular Surgery

## 2018-04-24 IMAGING — DX DG CHEST 2V
2 series · 2 of 2 positions shown · non-contrast
Comparison: None.

CLINICAL DATA: Cough, fever, chills and shortness of breath for 5
days.

EXAM:
CHEST  2 VIEW

[chest pa]
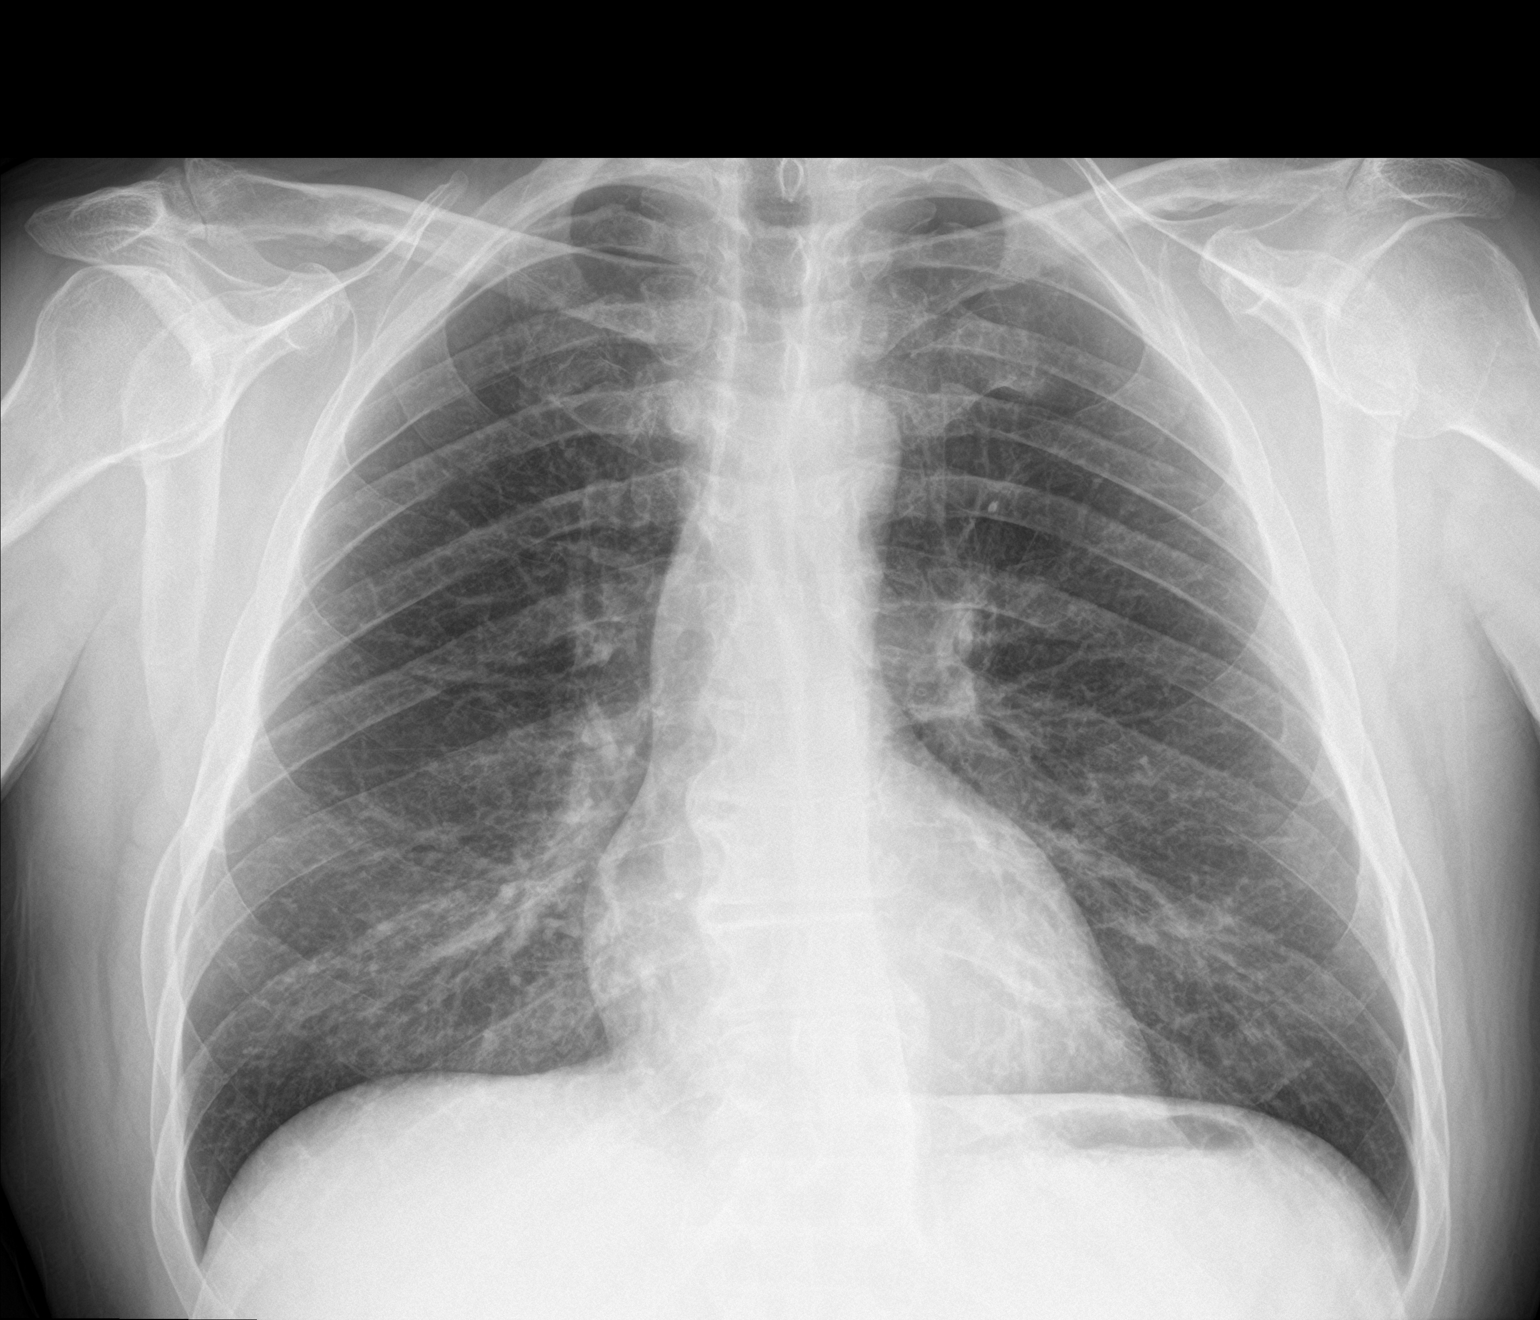

[chest lat]
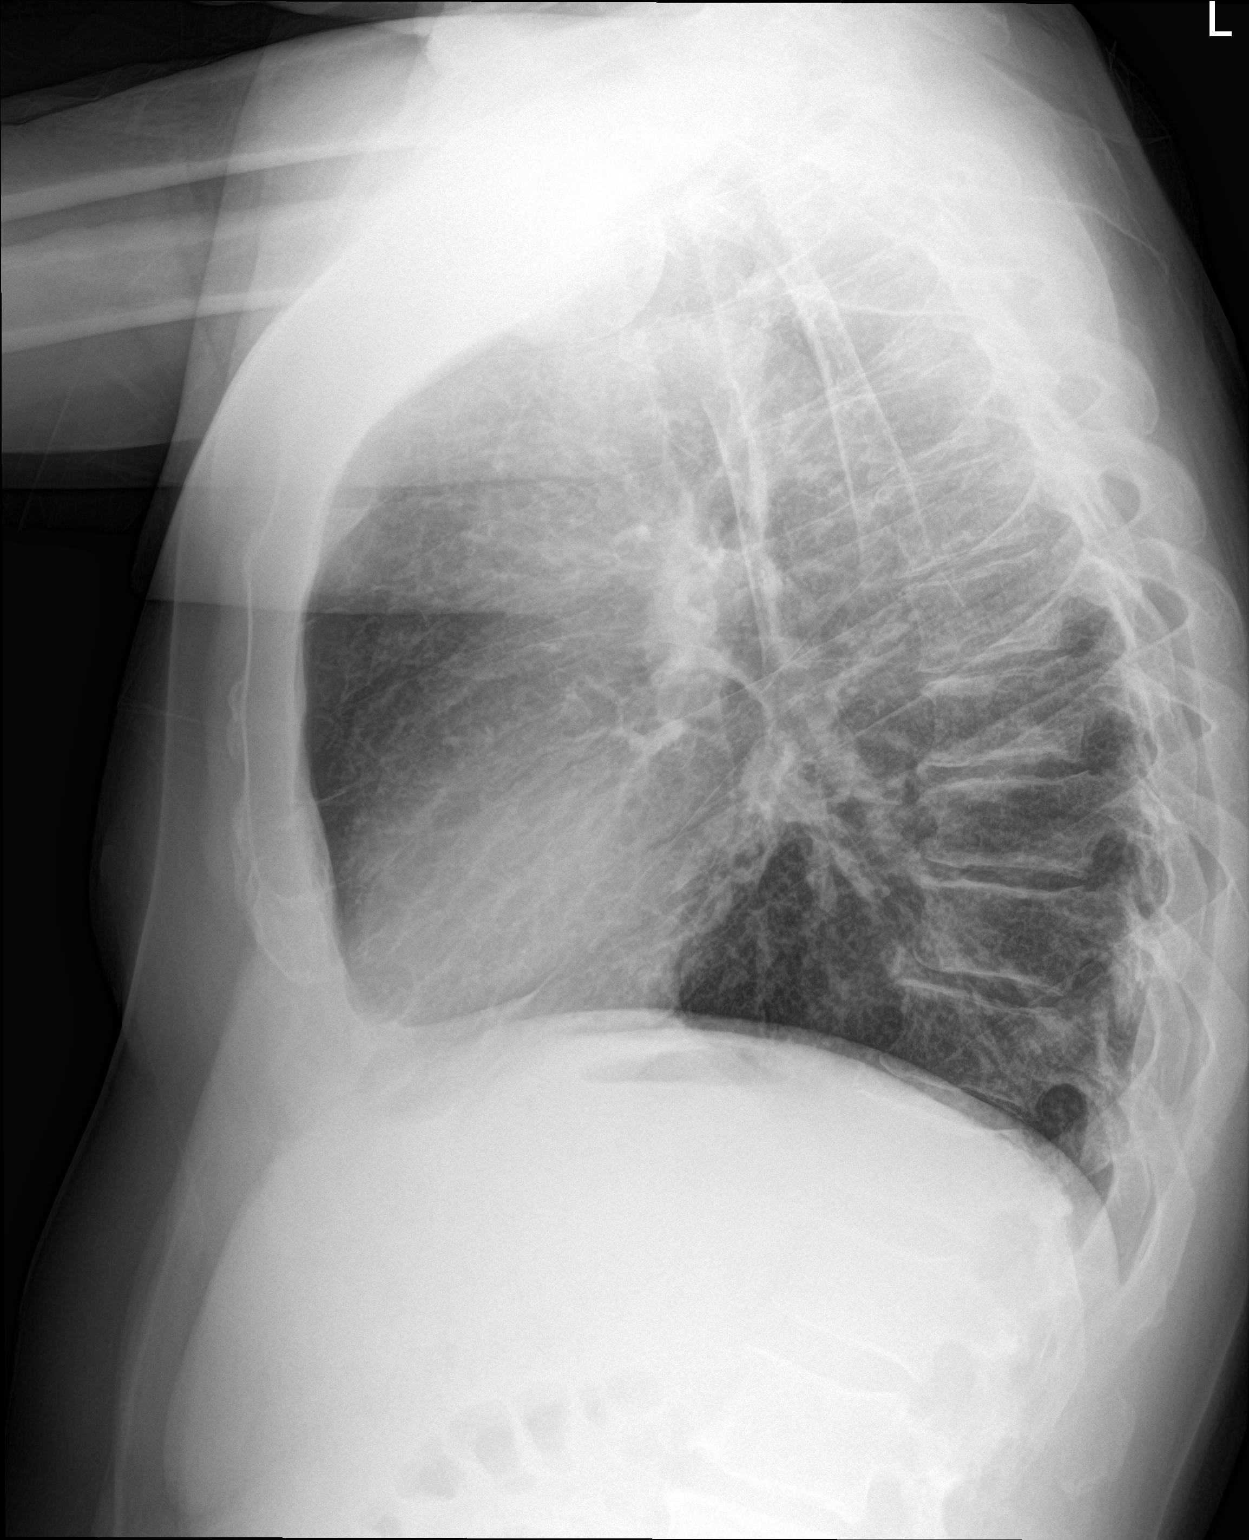

[2 of 2 positions shown; findings below may reference images not displayed]

FINDINGS: The cardiomediastinal silhouette is unremarkable.

Mild peribronchial thickening is identified.

There is no evidence of focal airspace disease, pulmonary edema,
suspicious pulmonary nodule/mass, pleural effusion, or pneumothorax.
No acute bony abnormalities are identified.
IMPRESSION: Mild peribronchial thickening which may represent acute bronchitis,
but is of uncertain chronicity.
